# Patient Record
Sex: Male | Born: 1965
Health system: Southern US, Community
[De-identification: ages and names within clinical notes are randomized; demographics above are authoritative.]

## PROBLEM LIST (undated history)

## (undated) DIAGNOSIS — M48061 Spinal stenosis, lumbar region without neurogenic claudication: Secondary | ICD-10-CM

## (undated) DIAGNOSIS — E785 Hyperlipidemia, unspecified: Secondary | ICD-10-CM

## (undated) DIAGNOSIS — K219 Gastro-esophageal reflux disease without esophagitis: Secondary | ICD-10-CM

## (undated) DIAGNOSIS — I10 Essential (primary) hypertension: Secondary | ICD-10-CM

## (undated) DIAGNOSIS — T7840XA Allergy, unspecified, initial encounter: Secondary | ICD-10-CM

## (undated) DIAGNOSIS — E559 Vitamin D deficiency, unspecified: Secondary | ICD-10-CM

## (undated) DIAGNOSIS — K649 Unspecified hemorrhoids: Secondary | ICD-10-CM

## (undated) HISTORY — DX: Allergy, unspecified, initial encounter: T78.40XA

## (undated) HISTORY — DX: Essential (primary) hypertension: I10

## (undated) HISTORY — DX: Spinal stenosis, lumbar region without neurogenic claudication: M48.061

## (undated) HISTORY — DX: Hyperlipidemia, unspecified: E78.5

## (undated) HISTORY — DX: Gastro-esophageal reflux disease without esophagitis: K21.9

## (undated) HISTORY — PX: NO PAST SURGERIES: SHX2092

## (undated) HISTORY — DX: Vitamin D deficiency, unspecified: E55.9

## (undated) HISTORY — DX: Unspecified hemorrhoids: K64.9

---

## 2001-10-22 ENCOUNTER — Encounter: Payer: Self-pay | Admitting: Emergency Medicine

## 2001-10-22 ENCOUNTER — Emergency Department (HOSPITAL_COMMUNITY): Admission: EM | Admit: 2001-10-22 | Discharge: 2001-10-22 | Payer: Self-pay | Admitting: Emergency Medicine

## 2002-09-09 ENCOUNTER — Emergency Department (HOSPITAL_COMMUNITY): Admission: EM | Admit: 2002-09-09 | Discharge: 2002-09-10 | Payer: Self-pay

## 2008-01-14 ENCOUNTER — Emergency Department (HOSPITAL_COMMUNITY): Admission: EM | Admit: 2008-01-14 | Discharge: 2008-01-14 | Payer: Self-pay | Admitting: Emergency Medicine

## 2008-01-28 ENCOUNTER — Emergency Department (HOSPITAL_COMMUNITY): Admission: EM | Admit: 2008-01-28 | Discharge: 2008-01-28 | Payer: Self-pay | Admitting: Emergency Medicine

## 2009-06-15 ENCOUNTER — Emergency Department (HOSPITAL_COMMUNITY): Admission: EM | Admit: 2009-06-15 | Discharge: 2009-06-15 | Payer: Self-pay | Admitting: Family Medicine

## 2009-10-21 ENCOUNTER — Ambulatory Visit: Payer: Self-pay | Admitting: Internal Medicine

## 2009-11-19 ENCOUNTER — Ambulatory Visit: Payer: Self-pay | Admitting: Internal Medicine

## 2009-11-19 ENCOUNTER — Encounter (INDEPENDENT_AMBULATORY_CARE_PROVIDER_SITE_OTHER): Payer: Self-pay | Admitting: Family Medicine

## 2009-11-19 LAB — CONVERTED CEMR LAB
ALT: 22 units/L (ref 0–53)
AST: 15 units/L (ref 0–37)
Albumin: 4.4 g/dL (ref 3.5–5.2)
Alkaline Phosphatase: 54 units/L (ref 39–117)
BUN: 17 mg/dL (ref 6–23)
Basophils Absolute: 0 10*3/uL (ref 0.0–0.1)
Basophils Relative: 1 % (ref 0–1)
CO2: 27 meq/L (ref 19–32)
Calcium: 9.5 mg/dL (ref 8.4–10.5)
Chloride: 97 meq/L (ref 96–112)
Cholesterol: 217 mg/dL — ABNORMAL HIGH (ref 0–200)
Creatinine, Ser: 1.03 mg/dL (ref 0.40–1.50)
Eosinophils Absolute: 0.2 10*3/uL (ref 0.0–0.7)
Eosinophils Relative: 4 % (ref 0–5)
Glucose, Bld: 106 mg/dL — ABNORMAL HIGH (ref 70–99)
HCT: 45.4 % (ref 39.0–52.0)
HDL: 46 mg/dL (ref >39)
Helicobacter Pylori Antibody-IgG: 2.8 — ABNORMAL HIGH
Hemoglobin: 14.6 g/dL (ref 13.0–17.0)
LDL Cholesterol: 123 mg/dL — ABNORMAL HIGH (ref 0–99)
Lymphocytes Relative: 40 % (ref 12–46)
Lymphs Abs: 2.3 10*3/uL (ref 0.7–4.0)
MCHC: 32.2 g/dL (ref 30.0–36.0)
MCV: 87.3 fL (ref 78.0–100.0)
Monocytes Absolute: 0.3 10*3/uL (ref 0.1–1.0)
Monocytes Relative: 5 % (ref 3–12)
Neutro Abs: 2.9 10*3/uL (ref 1.7–7.7)
Neutrophils Relative %: 50 % (ref 43–77)
Platelets: 200 10*3/uL (ref 150–400)
Potassium: 4.4 meq/L (ref 3.5–5.3)
RBC: 5.2 M/uL (ref 4.22–5.81)
RDW: 14.9 % (ref 11.5–15.5)
Sodium: 135 meq/L (ref 135–145)
TSH: 2.111 microintl units/mL (ref 0.350–4.500)
Total Bilirubin: 0.4 mg/dL (ref 0.3–1.2)
Total CHOL/HDL Ratio: 4.7
Total Protein: 6.7 g/dL (ref 6.0–8.3)
Triglycerides: 238 mg/dL — ABNORMAL HIGH (ref <150)
VLDL: 48 mg/dL — ABNORMAL HIGH (ref 0–40)
Vit D, 25-Hydroxy: 33 ng/mL (ref 30–89)
WBC: 5.8 10*3/uL (ref 4.0–10.5)

## 2010-01-12 ENCOUNTER — Ambulatory Visit: Payer: Self-pay | Admitting: Internal Medicine

## 2010-01-12 ENCOUNTER — Encounter (INDEPENDENT_AMBULATORY_CARE_PROVIDER_SITE_OTHER): Payer: Self-pay | Admitting: Family Medicine

## 2010-01-12 LAB — CONVERTED CEMR LAB: Microalb, Ur: 6.69 mg/dL — ABNORMAL HIGH (ref 0.00–1.89)

## 2010-04-14 ENCOUNTER — Encounter (INDEPENDENT_AMBULATORY_CARE_PROVIDER_SITE_OTHER): Payer: Self-pay | Admitting: Family Medicine

## 2010-04-14 LAB — CONVERTED CEMR LAB
BUN: 19 mg/dL (ref 6–23)
CO2: 27 meq/L (ref 19–32)
Calcium: 9.5 mg/dL (ref 8.4–10.5)
Chloride: 101 meq/L (ref 96–112)
Creatinine, Ser: 0.9 mg/dL (ref 0.40–1.50)
Glucose, Bld: 95 mg/dL (ref 70–99)
Hgb A1c MFr Bld: 6.3 % — ABNORMAL HIGH (ref <5.7)
Potassium: 4.6 meq/L (ref 3.5–5.3)
Sodium: 138 meq/L (ref 135–145)

## 2013-03-26 ENCOUNTER — Ambulatory Visit: Payer: Self-pay | Attending: Internal Medicine

## 2013-03-28 ENCOUNTER — Ambulatory Visit: Payer: No Typology Code available for payment source | Attending: Internal Medicine | Admitting: Internal Medicine

## 2013-03-28 ENCOUNTER — Encounter: Payer: Self-pay | Admitting: Internal Medicine

## 2013-03-28 VITALS — BP 133/90 | HR 94 | Temp 98.0°F | Resp 16 | Ht 76.0 in | Wt 234.0 lb

## 2013-03-28 DIAGNOSIS — Z720 Tobacco use: Secondary | ICD-10-CM

## 2013-03-28 DIAGNOSIS — F17200 Nicotine dependence, unspecified, uncomplicated: Secondary | ICD-10-CM

## 2013-03-28 DIAGNOSIS — K219 Gastro-esophageal reflux disease without esophagitis: Secondary | ICD-10-CM

## 2013-03-28 DIAGNOSIS — I10 Essential (primary) hypertension: Secondary | ICD-10-CM

## 2013-03-28 MED ORDER — OMEPRAZOLE 40 MG PO CPDR: 40.0000 mg | DELAYED_RELEASE_CAPSULE | Freq: Every day | ORAL | Status: DC

## 2013-03-28 MED ORDER — LOSARTAN POTASSIUM 50 MG PO TABS: 50.0000 mg | ORAL_TABLET | Freq: Every day | ORAL | Status: DC

## 2013-03-28 NOTE — Progress Notes (Signed)
PT HERE TO ESTABLISH CARE HTN,ACID REFLUX  TAKING PRESCRIBED LOSARTAN POTASSIUM 50MG  TAB BUT STATES TOO EXPENSIVE NO OTHER MEDICAL HX NOTED

## 2013-03-28 NOTE — Progress Notes (Signed)
Patient ID: Timothy Sanchez, male   DOB: 29-May-1966, 47 y.o.   MRN: [id number] Patient Demographics  Timothy Sanchez, is a 47 y.o. male  FAO:[id number]  ONG:[id number]  DOB - [birth year]  Chief Complaint  Patient presents with  . Establish Care  . Hypertension  . Gastrophageal Reflux        Subjective:   Timothy Sanchez today is here to establish primary care. Patient has No headache, No chest pain, No abdominal pain - No Nausea, No new weakness tingling or numbness, No Cough - SOB Patient states that his antihypertensive is working and he needs refill. He occasional has acid reflux. Otherwise has been doing very well.  Objective:    Filed Vitals:   03/28/13 1612  BP: 133/90  Pulse: 94  Temp: 98 F (36.7 C)  TempSrc: Oral  Resp: 16  Height: 6\' 4"  (1.93 m)  Weight: 234 lb (106.142 kg)  SpO2: 94%     ALLERGIES:  No Known Allergies  PAST MEDICAL HISTORY: Past Medical History  Diagnosis Date  . Hypertension     PAST SURGICAL HISTORY: No past surgical history on file.  FAMILY HISTORY: History reviewed. No pertinent family history.  MEDICATIONS AT HOME: Prior to Admission medications   Medication Sig Start Date End Date Taking? Authorizing Provider  losartan (COZAAR) 50 MG tablet Take 1 tablet (50 mg total) by mouth daily. 03/28/13  Yes Ripudeep Jenna Luo, MD  omeprazole (PRILOSEC) 40 MG capsule Take 1 capsule (40 mg total) by mouth daily. 03/28/13   Ripudeep Jenna Luo, MD    REVIEW OF SYSTEMS:  Constitutional:   No   Fevers, chills, fatigue.  HEENT:    No headaches, Sore throat,   Cardio-vascular: No chest pain,  Orthopnea, swelling in lower extremities, anasarca, palpitations  GI:  No abdominal pain, nausea, vomiting, diarrhea  Resp: No shortness of breath,  No coughing up of blood.No cough.No wheezing.  Skin:  no rash or lesions.  GU:  no dysuria, change in color of urine, no urgency or frequency.  No flank pain.  Musculoskeletal: No joint pain or swelling.   No decreased range of motion.  No back pain.  Psych: No change in mood or affect. No depression or anxiety.  No memory loss.   Exam  General appearance :Awake, alert, NAD, Speech Clear. HEENT: Atraumatic and Normocephalic, PERLA Neck: supple, no JVD. No cervical lymphadenopathy.  Chest: clear to auscultation bilaterally, no wheezing, rales or rhonchi CVS: S1 S2 regular, no murmurs.  Abdomen: soft, NBS, NT, ND, no gaurding, rigidity or rebound. Extremities: No cyanosis, clubbing, B/L Lower Ext shows no edema,  Neurology: Awake alert, and oriented X 3, CN II-XII intact, Non focal Skin:No Rash or lesions Wounds: N/A    Data Review   Basic Metabolic Panel: No results found for this basename: NA, K, CL, CO2, GLUCOSE, BUN, CREATININE, CALCIUM, MG, PHOS,  in the last 168 hours Liver Function Tests: No results found for this basename: AST, ALT, ALKPHOS, BILITOT, PROT, ALBUMIN,  in the last 168 hours  CBC: No results found for this basename: WBC, NEUTROABS, HGB, HCT, MCV, PLT,  in the last 168 hours ------------------------------------------------------------------------------------------------------------------ No results found for this basename: HGBA1C,  in the last 72 hours ------------------------------------------------------------------------------------------------------------------ No results found for this basename: CHOL, HDL, LDLCALC, TRIG, CHOLHDL, LDLDIRECT,  in the last 72 hours ------------------------------------------------------------------------------------------------------------------ No results found for this basename: TSH, T4TOTAL, FREET3, T3FREE, THYROIDAB,  in the last 72 hours ------------------------------------------------------------------------------------------------------------------ No results found for this basename:  VITAMINB12, FOLATE, FERRITIN, TIBC, IRON, RETICCTPCT,  in the last 72 hours  Coagulation profile  No results found for this basename:  INR, PROTIME,  in the last 168 hours    Assessment & Plan   Active Problems: Hypertension - Currently stable, continue losartan 50 mg daily  GERD: Placed on omeprazole 40 mg daily  Nicotine abuse - Consultation to quit smoking  Health maintenance: Will get labs and give flu vaccine today  Recommendations: CBC, BMET, lipid panel  Follow-up in 3 months, earlier if any abnormality in the labs   RAI,RIPUDEEP M.D. 03/28/2013, 4:35 PM

## 2013-03-29 LAB — CBC WITH DIFFERENTIAL/PLATELET
Basophils Absolute: 0 10*3/uL (ref 0.0–0.1)
Basophils Relative: 1 % (ref 0–1)
Eosinophils Absolute: 0.1 10*3/uL (ref 0.0–0.7)
Eosinophils Relative: 2 % (ref 0–5)
HCT: 43 % (ref 39.0–52.0)
Hemoglobin: 14.3 g/dL (ref 13.0–17.0)
Lymphocytes Relative: 25 % (ref 12–46)
Lymphs Abs: 1.3 10*3/uL (ref 0.7–4.0)
MCH: 27.7 pg (ref 26.0–34.0)
MCHC: 33.3 g/dL (ref 30.0–36.0)
MCV: 83.3 fL (ref 78.0–100.0)
Monocytes Absolute: 0.3 10*3/uL (ref 0.1–1.0)
Monocytes Relative: 7 % (ref 3–12)
Neutro Abs: 3.5 10*3/uL (ref 1.7–7.7)
Neutrophils Relative %: 65 % (ref 43–77)
Platelets: 199 10*3/uL (ref 150–400)
RBC: 5.16 MIL/uL (ref 4.22–5.81)
RDW: 14.6 % (ref 11.5–15.5)
WBC: 5.3 10*3/uL (ref 4.0–10.5)

## 2013-03-29 LAB — BASIC METABOLIC PANEL
BUN: 20 mg/dL (ref 6–23)
CO2: 25 mEq/L (ref 19–32)
Calcium: 9.1 mg/dL (ref 8.4–10.5)
Chloride: 101 mEq/L (ref 96–112)
Creat: 0.77 mg/dL (ref 0.50–1.35)
Glucose, Bld: 99 mg/dL (ref 70–99)
Potassium: 4.7 mEq/L (ref 3.5–5.3)
Sodium: 134 mEq/L — ABNORMAL LOW (ref 135–145)

## 2013-03-29 LAB — LIPID PANEL
Cholesterol: 238 mg/dL — ABNORMAL HIGH (ref 0–200)
HDL: 47 mg/dL (ref >39)
Total CHOL/HDL Ratio: 5.1 Ratio
Triglycerides: 460 mg/dL — ABNORMAL HIGH (ref <150)

## 2013-06-27 ENCOUNTER — Ambulatory Visit: Payer: No Typology Code available for payment source

## 2013-09-09 ENCOUNTER — Other Ambulatory Visit: Payer: Self-pay | Admitting: Internal Medicine

## 2013-09-09 MED ORDER — OMEPRAZOLE 40 MG PO CPDR: 40.0000 mg | DELAYED_RELEASE_CAPSULE | Freq: Every day | ORAL | Status: DC

## 2013-09-09 MED ORDER — LOSARTAN POTASSIUM 50 MG PO TABS: 50.0000 mg | ORAL_TABLET | Freq: Every day | ORAL | Status: DC

## 2014-02-05 ENCOUNTER — Other Ambulatory Visit: Payer: Self-pay | Admitting: Internal Medicine

## 2014-02-05 DIAGNOSIS — I10 Essential (primary) hypertension: Secondary | ICD-10-CM

## 2014-02-05 DIAGNOSIS — K219 Gastro-esophageal reflux disease without esophagitis: Secondary | ICD-10-CM

## 2014-03-06 ENCOUNTER — Telehealth: Payer: Self-pay | Admitting: Internal Medicine

## 2014-03-06 ENCOUNTER — Other Ambulatory Visit: Payer: Self-pay | Admitting: Internal Medicine

## 2014-03-06 DIAGNOSIS — I10 Essential (primary) hypertension: Secondary | ICD-10-CM

## 2014-03-06 DIAGNOSIS — K219 Gastro-esophageal reflux disease without esophagitis: Secondary | ICD-10-CM

## 2014-03-06 NOTE — Telephone Encounter (Signed)
Pt has appt on 03/11/14 for med refill but has run out of HTN meds and request refill. Please f/u with pt.

## 2014-03-07 ENCOUNTER — Encounter (HOSPITAL_COMMUNITY): Payer: Self-pay | Admitting: Emergency Medicine

## 2014-03-07 ENCOUNTER — Emergency Department (INDEPENDENT_AMBULATORY_CARE_PROVIDER_SITE_OTHER)
Admission: EM | Admit: 2014-03-07 | Discharge: 2014-03-07 | Disposition: A | Discharge status: Home / Self Care | Payer: Self-pay | Source: Home / Self Care | Attending: Family Medicine | Admitting: Family Medicine

## 2014-03-07 DIAGNOSIS — I10 Essential (primary) hypertension: Secondary | ICD-10-CM

## 2014-03-07 MED ORDER — LOSARTAN POTASSIUM 50 MG PO TABS: 50.0000 mg | ORAL_TABLET | Freq: Every day | ORAL | Status: DC

## 2014-03-07 NOTE — ED Provider Notes (Signed)
CSN: [id number]     Arrival date & time 03/07/14  51 History   First MD Initiated Contact with Patient 03/07/14 1524     Chief Complaint  Patient presents with  . Medication Refill   (Consider location/radiation/quality/duration/timing/severity/associated sxs/prior Treatment) Patient is a 48 y.o. male presenting with hypertension. The history is provided by the patient.  Hypertension This is a chronic problem. Episode onset: here for med refill on bp med, no side effectsor problems., has appt with [facility] on mon. The problem has not changed since onset.Pertinent negatives include no chest pain and no headaches.    Past Medical History  Diagnosis Date  . Hypertension    History reviewed. No pertinent past surgical history. History reviewed. No pertinent family history. History  Substance Use Topics  . Smoking status: Heavy Tobacco Smoker -- 1.50 packs/day for 25 years    Types: Cigarettes    Start date: 03/26/2013  . Smokeless tobacco: Not on file  . Alcohol Use: Not on file    Review of Systems  Constitutional: Negative.   Respiratory: Negative.   Cardiovascular: Negative.  Negative for chest pain.  Neurological: Negative for headaches.    Allergies  Review of patient's allergies indicates no known allergies.  Home Medications   Prior to Admission medications   Medication Sig Start Date End Date Taking? Authorizing Provider  losartan (COZAAR) 50 MG tablet TAKE 1 TABLET BY MOUTH DAILY. *NEEDS APPOITNMENT FOR MORE REFILLS* 03/06/14   Angelica Chessman, MD  losartan (COZAAR) 50 MG tablet Take 1 tablet (50 mg total) by mouth daily. 03/07/14   Billy Fischer, MD  omeprazole (PRILOSEC) 40 MG capsule TAKE 1 CAPSULE BY MOUTH DAILY. *NEEDS APPOINTMENT FOR MORE REFILLS* 03/06/14   Angelica Chessman, MD   BP 137/93  Pulse 104  Temp(Src) 98.7 F (37.1 C) (Oral)  Resp 20  SpO2 94% Physical Exam  Nursing note and vitals reviewed. Constitutional: He is oriented to person, place, and  time. He appears well-developed and well-nourished.  Neck: Normal range of motion. Neck supple.  Cardiovascular: Normal heart sounds.   Lymphadenopathy:    He has no cervical adenopathy.  Neurological: He is alert and oriented to person, place, and time.    ED Course  Procedures (including critical care time) Labs Review Labs Reviewed - No data to display  Imaging Review No results found.   MDM   1. Essential hypertension        Billy Fischer, MD 03/07/14 1547

## 2014-03-07 NOTE — Discharge Instructions (Signed)
See your doctor for further routine medical care. No more smoking.

## 2014-03-07 NOTE — ED Notes (Signed)
Here  For  Medication refill      he needs some  losarteen   50 mg  Daily

## 2014-03-11 ENCOUNTER — Ambulatory Visit: Payer: Self-pay | Attending: Internal Medicine | Admitting: Internal Medicine

## 2014-03-11 ENCOUNTER — Encounter: Payer: Self-pay | Admitting: Internal Medicine

## 2014-03-11 VITALS — BP 130/90 | HR 104 | Temp 97.6°F | Resp 16 | Wt 225.8 lb

## 2014-03-11 DIAGNOSIS — Z720 Tobacco use: Secondary | ICD-10-CM

## 2014-03-11 DIAGNOSIS — Z139 Encounter for screening, unspecified: Secondary | ICD-10-CM

## 2014-03-11 DIAGNOSIS — K219 Gastro-esophageal reflux disease without esophagitis: Secondary | ICD-10-CM | POA: Insufficient documentation

## 2014-03-11 DIAGNOSIS — F17200 Nicotine dependence, unspecified, uncomplicated: Secondary | ICD-10-CM | POA: Insufficient documentation

## 2014-03-11 DIAGNOSIS — I10 Essential (primary) hypertension: Secondary | ICD-10-CM | POA: Insufficient documentation

## 2014-03-11 DIAGNOSIS — Z79899 Other long term (current) drug therapy: Secondary | ICD-10-CM | POA: Insufficient documentation

## 2014-03-11 DIAGNOSIS — E781 Pure hyperglyceridemia: Secondary | ICD-10-CM | POA: Insufficient documentation

## 2014-03-11 LAB — COMPLETE METABOLIC PANEL WITH GFR
ALK PHOS: 50 U/L (ref 39–117)
ALT: 20 U/L (ref 0–53)
AST: 19 U/L (ref 0–37)
Albumin: 4.3 g/dL (ref 3.5–5.2)
BUN: 21 mg/dL (ref 6–23)
CALCIUM: 8.9 mg/dL (ref 8.4–10.5)
CHLORIDE: 100 meq/L (ref 96–112)
CO2: 28 mEq/L (ref 19–32)
Creat: 0.85 mg/dL (ref 0.50–1.35)
GFR, Est African American: >89 mL/min
GFR, Est Non African American: >89 mL/min
Glucose, Bld: 107 mg/dL — ABNORMAL HIGH (ref 70–99)
Potassium: 4.3 mEq/L (ref 3.5–5.3)
Sodium: 136 mEq/L (ref 135–145)
Total Bilirubin: 0.5 mg/dL (ref 0.2–1.2)
Total Protein: 6.6 g/dL (ref 6.0–8.3)

## 2014-03-11 LAB — CBC WITH DIFFERENTIAL/PLATELET
BASOS ABS: 0 10*3/uL (ref 0.0–0.1)
BASOS PCT: 1 % (ref 0–1)
Eosinophils Absolute: 0.2 10*3/uL (ref 0.0–0.7)
Eosinophils Relative: 5 % (ref 0–5)
HEMATOCRIT: 44.1 % (ref 39.0–52.0)
Hemoglobin: 14.7 g/dL (ref 13.0–17.0)
Lymphocytes Relative: 37 % (ref 12–46)
Lymphs Abs: 1.7 10*3/uL (ref 0.7–4.0)
MCH: 27.9 pg (ref 26.0–34.0)
MCHC: 33.3 g/dL (ref 30.0–36.0)
MCV: 83.7 fL (ref 78.0–100.0)
MONO ABS: 0.4 10*3/uL (ref 0.1–1.0)
Monocytes Relative: 8 % (ref 3–12)
Neutro Abs: 2.2 10*3/uL (ref 1.7–7.7)
Neutrophils Relative %: 49 % (ref 43–77)
Platelets: 213 10*3/uL (ref 150–400)
RBC: 5.27 MIL/uL (ref 4.22–5.81)
RDW: 14.4 % (ref 11.5–15.5)
WBC: 4.5 10*3/uL (ref 4.0–10.5)

## 2014-03-11 LAB — LIPID PANEL
CHOL/HDL RATIO: 5.9 ratio
CHOLESTEROL: 266 mg/dL — AB (ref 0–200)
HDL: 45 mg/dL (ref >39)
LDL Cholesterol: 169 mg/dL — ABNORMAL HIGH (ref 0–99)
Triglycerides: 260 mg/dL — ABNORMAL HIGH (ref <150)
VLDL: 52 mg/dL — ABNORMAL HIGH (ref 0–40)

## 2014-03-11 MED ORDER — OMEPRAZOLE 40 MG PO CPDR: 40.0000 mg | DELAYED_RELEASE_CAPSULE | Freq: Every day | ORAL | Status: DC

## 2014-03-11 MED ORDER — LOSARTAN POTASSIUM 50 MG PO TABS: ORAL_TABLET | ORAL | Status: DC

## 2014-03-11 NOTE — Progress Notes (Signed)
MRN: [id number] Name: Timothy Sanchez  Sex: male Age: 48 y.o. DOB: 1965/11/01  Allergies: Review of patient's allergies indicates no known allergies.  Chief Complaint  Patient presents with  . Follow-up    HTN    HPI: Patient is 48 y.o. male who has history of hypertension, GERD , hypertriglyceridemia comes today for followup and requesting refill on his medications, denies any headache dizziness chest and shortness of breath, patient smokes cigarettes, I have advised patient to quit smoking, patient has never taken any medication for elevated triglycerides. Today the blood pressure is borderline elevated. As per patient is compliant in taking medications.   Past Medical History  Diagnosis Date  . Hypertension     History reviewed. No pertinent past surgical history.    Medication List       This list is accurate as of: 03/11/14  5:23 PM.  Always use your most recent med list.               losartan 50 MG tablet  Commonly known as:  COZAAR  TAKE 1 TABLET BY MOUTH DAILY.     omeprazole 40 MG capsule  Commonly known as:  PRILOSEC  Take 1 capsule (40 mg total) by mouth daily.        Meds ordered this encounter  Medications  . losartan (COZAAR) 50 MG tablet    Sig: TAKE 1 TABLET BY MOUTH DAILY.    Dispense:  30 tablet    Refill:  3  . omeprazole (PRILOSEC) 40 MG capsule    Sig: Take 1 capsule (40 mg total) by mouth daily.    Dispense:  30 capsule    Refill:  3    Immunization History  Administered Date(s) Administered  . Influenza,inj,Quad PF,36+ Mos 03/28/2013    History reviewed. No pertinent family history.  History  Substance Use Topics  . Smoking status: Heavy Tobacco Smoker -- 1.50 packs/day for 25 years    Types: Cigarettes    Start date: 03/26/2013  . Smokeless tobacco: Not on file  . Alcohol Use: Not on file    Review of Systems   As noted in HPI  Filed Vitals:   03/11/14 1718  BP: 130/90  Pulse:   Temp:   Resp:      Physical Exam  Physical Exam  Constitutional: No distress.  Eyes: EOM are normal. Pupils are equal, round, and reactive to light.  Cardiovascular: Normal rate and regular rhythm.   Pulmonary/Chest: Breath sounds normal. No respiratory distress. He has no wheezes. He has no rales.  Abdominal: Soft. There is no tenderness.  Musculoskeletal: He exhibits no edema.    CBC    Component Value Date/Time   WBC 5.3 03/28/2013 1639   RBC 5.16 03/28/2013 1639   HGB 14.3 03/28/2013 1639   HCT 43.0 03/28/2013 1639   PLT 199 03/28/2013 1639   MCV 83.3 03/28/2013 1639   LYMPHSABS 1.3 03/28/2013 1639   MONOABS 0.3 03/28/2013 1639   EOSABS 0.1 03/28/2013 1639   BASOSABS 0.0 03/28/2013 1639    CMP     Component Value Date/Time   NA 134* 03/28/2013 1639   K 4.7 03/28/2013 1639   CL 101 03/28/2013 1639   CO2 25 03/28/2013 1639   GLUCOSE 99 03/28/2013 1639   BUN 20 03/28/2013 1639   CREATININE 0.77 03/28/2013 1639   CREATININE 0.90 04/14/2010 2206   CALCIUM 9.1 03/28/2013 1639   PROT 6.7 11/19/2009 2035   ALBUMIN 4.4  11/19/2009 2035   AST 15 11/19/2009 2035   ALT 22 11/19/2009 2035   ALKPHOS 54 11/19/2009 2035   BILITOT 0.4 11/19/2009 2035    Lab Results  Component Value Date/Time   CHOL 238* 03/28/2013  4:39 PM    No components found with this basename: hga1c    Lab Results  Component Value Date/Time   AST 15 11/19/2009  8:35 PM    Assessment and Plan  Essential hypertension - Plan: Have advised patient for DASH diet, continue with her losartan (COZAAR) 50 MG tablet,, will repeat  COMPLETE METABOLIC PANEL WITH GFR  Gastroesophageal reflux disease without esophagitis - Plan: Zestril modification, continue with omeprazole (PRILOSEC) 40 MG capsule  Nicotine abuse Consultation to quit smoking, as per patient he will try on his own.  Hypertriglyceridemia - Plan: Advised for low-fat diet, repeat  Lipid panel  Screening - Plan: CBC with Differential, TSH, Vit D  25 hydroxy (rtn osteoporosis  monitoring)     Return in about 3 months (around 06/11/2014) for hypertension, hypertriglycridemia.  Lorayne Marek, MD

## 2014-03-11 NOTE — Progress Notes (Signed)
Patient here for follow up on his HTN and in need  of medication refills

## 2014-03-11 NOTE — Patient Instructions (Addendum)
DASH Eating Plan DASH stands for "Dietary Approaches to Stop Hypertension." The DASH eating plan is a healthy eating plan that has been shown to reduce high blood pressure (hypertension). Additional health benefits may include reducing the risk of type 2 diabetes mellitus, heart disease, and stroke. The DASH eating plan may also help with weight loss. WHAT DO I NEED TO KNOW ABOUT THE DASH EATING PLAN? For the DASH eating plan, you will follow these general guidelines:  Choose foods with a percent daily value for sodium of less than 5% (as listed on the food label).  Use salt-free seasonings or herbs instead of table salt or sea salt.  Check with your health care provider or pharmacist before using salt substitutes.  Eat lower-sodium products, often labeled as "lower sodium" or "no salt added."  Eat fresh foods.  Eat more vegetables, fruits, and low-fat dairy products.  Choose whole grains. Look for the word "whole" as the first word in the ingredient list.  Choose fish and skinless chicken or turkey more often than red meat. Limit fish, poultry, and meat to 6 oz (170 g) each day.  Limit sweets, desserts, sugars, and sugary drinks.  Choose heart-healthy fats.  Limit cheese to 1 oz (28 g) per day.  Eat more home-cooked food and less restaurant, buffet, and fast food.  Limit fried foods.  Cook foods using methods other than frying.  Limit canned vegetables. If you do use them, rinse them well to decrease the sodium.  When eating at a restaurant, ask that your food be prepared with less salt, or no salt if possible. WHAT FOODS CAN I EAT? Seek help from a dietitian for individual calorie needs. Grains Whole grain or whole wheat bread. Brown rice. Whole grain or whole wheat pasta. Quinoa, bulgur, and whole grain cereals. Low-sodium cereals. Corn or whole wheat flour tortillas. Whole grain cornbread. Whole grain crackers. Low-sodium crackers. Vegetables Fresh or frozen vegetables  (raw, steamed, roasted, or grilled). Low-sodium or reduced-sodium tomato and vegetable juices. Low-sodium or reduced-sodium tomato sauce and paste. Low-sodium or reduced-sodium canned vegetables.  Fruits All fresh, canned (in natural juice), or frozen fruits. Meat and Other Protein Products Ground beef (85% or leaner), grass-fed beef, or beef trimmed of fat. Skinless chicken or turkey. Ground chicken or turkey. Pork trimmed of fat. All fish and seafood. Eggs. Dried beans, peas, or lentils. Unsalted nuts and seeds. Unsalted canned beans. Dairy Low-fat dairy products, such as skim or 1% milk, 2% or reduced-fat cheeses, low-fat ricotta or cottage cheese, or plain low-fat yogurt. Low-sodium or reduced-sodium cheeses. Fats and Oils Tub margarines without trans fats. Light or reduced-fat mayonnaise and salad dressings (reduced sodium). Avocado. Safflower, olive, or canola oils. Natural peanut or almond butter. Other Unsalted popcorn and pretzels. The items listed above may not be a complete list of recommended foods or beverages. Contact your dietitian for more options. WHAT FOODS ARE NOT RECOMMENDED? Grains White bread. White pasta. White rice. Refined cornbread. Bagels and croissants. Crackers that contain trans fat. Vegetables Creamed or fried vegetables. Vegetables in a cheese sauce. Regular canned vegetables. Regular canned tomato sauce and paste. Regular tomato and vegetable juices. Fruits Dried fruits. Canned fruit in light or heavy syrup. Fruit juice. Meat and Other Protein Products Fatty cuts of meat. Ribs, chicken wings, bacon, sausage, bologna, salami, chitterlings, fatback, hot dogs, bratwurst, and packaged luncheon meats. Salted nuts and seeds. Canned beans with salt. Dairy Whole or 2% milk, cream, half-and-half, and cream cheese. Whole-fat or sweetened yogurt. Full-fat   cheeses or blue cheese. Nondairy creamers and whipped toppings. Processed cheese, cheese spreads, or cheese  curds. Condiments Onion and garlic salt, seasoned salt, table salt, and sea salt. Canned and packaged gravies. Worcestershire sauce. Tartar sauce. Barbecue sauce. Teriyaki sauce. Soy sauce, including reduced sodium. Steak sauce. Fish sauce. Oyster sauce. Cocktail sauce. Horseradish. Ketchup and mustard. Meat flavorings and tenderizers. Bouillon cubes. Hot sauce. Tabasco sauce. Marinades. Taco seasonings. Relishes. Fats and Oils Butter, stick margarine, lard, shortening, ghee, and bacon fat. Coconut, palm kernel, or palm oils. Regular salad dressings. Other Pickles and olives. Salted popcorn and pretzels. The items listed above may not be a complete list of foods and beverages to avoid. Contact your dietitian for more information. WHERE CAN I FIND MORE INFORMATION? National Heart, Lung, and [employer] Institute: www.nhlbi.nih.gov/health/health-topics/topics/dash/ Document Released: 06/30/2011 Document Revised: 11/25/2013 Document Reviewed: 05/15/2013 Exit[employer] Patient Information 2015 [employer]. This information is not intended to replace advice given to you by your health care provider. Make sure you discuss any questions you have with your health care provider. Fat and Cholesterol Control Diet Fat and cholesterol levels in your blood and organs are influenced by your diet. High levels of fat and cholesterol may lead to diseases of the heart, small and large blood vessels, gallbladder, liver, and pancreas. CONTROLLING FAT AND CHOLESTEROL WITH DIET Although exercise and lifestyle factors are important, your diet is key. That is because certain foods are known to raise cholesterol and others to lower it. The goal is to balance foods for their effect on cholesterol and more importantly, to replace saturated and trans fat with other types of fat, such as monounsaturated fat, polyunsaturated fat, and omega-3 fatty acids. On average, a person should consume no more than 15 to 17 g of saturated fat daily.  Saturated and trans fats are considered "bad" fats, and they will raise LDL cholesterol. Saturated fats are primarily found in animal products such as meats, butter, and cream. However, that does not mean you need to give up all your favorite foods. Today, there are good tasting, low-fat, low-cholesterol substitutes for most of the things you like to eat. Choose low-fat or nonfat alternatives. Choose round or loin cuts of red meat. These types of cuts are lowest in fat and cholesterol. Chicken (without the skin), fish, veal, and ground turkey breast are great choices. Eliminate fatty meats, such as hot dogs and salami. Even shellfish have little or no saturated fat. Have a 3 oz (85 g) portion when you eat lean meat, poultry, or fish. Trans fats are also called "partially hydrogenated oils." They are oils that have been scientifically manipulated so that they are solid at room temperature resulting in a longer shelf life and improved taste and texture of foods in which they are added. Trans fats are found in stick margarine, some tub margarines, cookies, crackers, and baked goods.  When baking and cooking, oils are a great substitute for butter. The monounsaturated oils are especially beneficial since it is believed they lower LDL and raise HDL. The oils you should avoid entirely are saturated tropical oils, such as coconut and palm.  Remember to eat a lot from food groups that are naturally free of saturated and trans fat, including fish, fruit, vegetables, beans, grains (barley, rice, couscous, bulgur wheat), and pasta (without cream sauces).  IDENTIFYING FOODS THAT LOWER FAT AND CHOLESTEROL  Soluble fiber may lower your cholesterol. This type of fiber is found in fruits such as apples, vegetables such as broccoli, potatoes, and carrots,   legumes such as beans, peas, and lentils, and grains such as barley. Foods fortified with plant sterols (phytosterol) may also lower cholesterol. You should eat at least 2 g  per day of these foods for a cholesterol lowering effect.  Read package labels to identify low-saturated fats, trans fat free, and low-fat foods at the supermarket. Select cheeses that have only 2 to 3 g saturated fat per ounce. Use a heart-healthy tub margarine that is free of trans fats or partially hydrogenated oil. When buying baked goods (cookies, crackers), avoid partially hydrogenated oils. Breads and muffins should be made from whole grains (whole-wheat or whole oat flour, instead of "flour" or "enriched flour"). Buy non-creamy canned soups with reduced salt and no added fats.  FOOD PREPARATION TECHNIQUES  Never deep-fry. If you must fry, either stir-fry, which uses very little fat, or use non-stick cooking sprays. When possible, broil, bake, or roast meats, and steam vegetables. Instead of putting butter or margarine on vegetables, use lemon and herbs, applesauce, and cinnamon (for squash and sweet potatoes). Use nonfat yogurt, salsa, and low-fat dressings for salads.  LOW-SATURATED FAT / LOW-FAT FOOD SUBSTITUTES Meats / Saturated Fat (g)  Avoid: Steak, marbled (3 oz/85 g) / 11 g  Choose: Steak, lean (3 oz/85 g) / 4 g  Avoid: Hamburger (3 oz/85 g) / 7 g  Choose: Hamburger, lean (3 oz/85 g) / 5 g  Avoid: Ham (3 oz/85 g) / 6 g  Choose: Ham, lean cut (3 oz/85 g) / 2.4 g  Avoid: Chicken, with skin, dark meat (3 oz/85 g) / 4 g  Choose: Chicken, skin removed, dark meat (3 oz/85 g) / 2 g  Avoid: Chicken, with skin, light meat (3 oz/85 g) / 2.5 g  Choose: Chicken, skin removed, light meat (3 oz/85 g) / 1 g Dairy / Saturated Fat (g)  Avoid: Whole milk (1 cup) / 5 g  Choose: Low-fat milk, 2% (1 cup) / 3 g  Choose: Low-fat milk, 1% (1 cup) / 1.5 g  Choose: Skim milk (1 cup) / 0.3 g  Avoid: Hard cheese (1 oz/28 g) / 6 g  Choose: Skim milk cheese (1 oz/28 g) / 2 to 3 g  Avoid: Cottage cheese, 4% fat (1 cup) / 6.5 g  Choose: Low-fat cottage cheese, 1% fat (1 cup) / 1.5  g  Avoid: Ice cream (1 cup) / 9 g  Choose: Sherbet (1 cup) / 2.5 g  Choose: Nonfat frozen yogurt (1 cup) / 0.3 g  Choose: Frozen fruit bar / trace  Avoid: Whipped cream (1 tbs) / 3.5 g  Choose: Nondairy whipped topping (1 tbs) / 1 g Condiments / Saturated Fat (g)  Avoid: Mayonnaise (1 tbs) / 2 g  Choose: Low-fat mayonnaise (1 tbs) / 1 g  Avoid: Butter (1 tbs) / 7 g  Choose: Extra light margarine (1 tbs) / 1 g  Avoid: Coconut oil (1 tbs) / 11.8 g  Choose: Olive oil (1 tbs) / 1.8 g  Choose: Corn oil (1 tbs) / 1.7 g  Choose: Safflower oil (1 tbs) / 1.2 g  Choose: Sunflower oil (1 tbs) / 1.4 g  Choose: Soybean oil (1 tbs) / 2.4 g  Choose: Canola oil (1 tbs) / 1 g Document Released: 07/11/2005 Document Revised: 11/05/2012 Document Reviewed: 10/09/2013 [employer] Patient Information 2015 [employer]. This information is not intended to replace advice given to you by your health care provider. Make sure you discuss any questions you have with your health care   provider.  

## 2014-03-12 LAB — TSH: TSH: 1.256 u[IU]/mL (ref 0.350–4.500)

## 2014-03-12 LAB — VITAMIN D 25 HYDROXY (VIT D DEFICIENCY, FRACTURES): Vit D, 25-Hydroxy: 12 ng/mL — ABNORMAL LOW (ref 30–89)

## 2014-03-13 ENCOUNTER — Telehealth: Payer: Self-pay

## 2014-03-13 MED ORDER — ATORVASTATIN CALCIUM 20 MG PO TABS: 20.0000 mg | ORAL_TABLET | Freq: Every day | ORAL | Status: DC

## 2014-03-13 MED ORDER — VITAMIN D (ERGOCALCIFEROL) 1.25 MG (50000 UNIT) PO CAPS: 50000.0000 [IU] | ORAL_CAPSULE | ORAL | Status: DC

## 2014-03-13 NOTE — Telephone Encounter (Signed)
Patient not available Mail box is disabled unable to leave message

## 2014-03-13 NOTE — Telephone Encounter (Signed)
Message copied by Dorothe Pea on Thu Mar 13, 2014 10:17 AM ------      Message from: Lorayne Marek      Created: Wed Mar 12, 2014 10:31 AM       Blood work reviewed, noticed low vitamin D, call patient advise to start ergocalciferol 50,000 units once a week for the duration of  12 weeks.      Also patient has high cholesterol and high triglycerides, advise him for low-fat diet, and take Lipitor 20 mg daily, will repeat lipid panel on the next visit. ------

## 2014-07-04 ENCOUNTER — Other Ambulatory Visit: Payer: Self-pay | Admitting: Internal Medicine

## 2014-08-04 ENCOUNTER — Other Ambulatory Visit: Payer: Self-pay | Admitting: Internal Medicine

## 2014-08-06 ENCOUNTER — Other Ambulatory Visit: Payer: Self-pay | Admitting: Internal Medicine

## 2014-08-06 ENCOUNTER — Other Ambulatory Visit: Payer: Self-pay | Admitting: *Deleted

## 2014-08-06 DIAGNOSIS — K219 Gastro-esophageal reflux disease without esophagitis: Secondary | ICD-10-CM

## 2014-08-06 DIAGNOSIS — I10 Essential (primary) hypertension: Secondary | ICD-10-CM

## 2014-08-06 MED ORDER — OMEPRAZOLE 40 MG PO CPDR: 40.0000 mg | DELAYED_RELEASE_CAPSULE | Freq: Every day | ORAL | Status: DC

## 2014-08-06 MED ORDER — LOSARTAN POTASSIUM 50 MG PO TABS: ORAL_TABLET | ORAL | Status: DC

## 2014-08-06 NOTE — Telephone Encounter (Signed)
Patient came into facility to request a medication refill for omeprazole (PRILOSEC) 40 MG capsule. Please f/u with pt.

## 2014-08-06 NOTE — Telephone Encounter (Signed)
Pt in our front office requesting Rx refills Rx Omeprazole and Rx Losartan was send to [facility]

## 2014-09-05 ENCOUNTER — Other Ambulatory Visit: Payer: Self-pay | Admitting: *Deleted

## 2014-09-05 ENCOUNTER — Other Ambulatory Visit: Payer: Self-pay | Admitting: General Practice

## 2014-09-05 ENCOUNTER — Other Ambulatory Visit: Payer: Self-pay | Admitting: Internal Medicine

## 2014-09-05 DIAGNOSIS — I10 Essential (primary) hypertension: Secondary | ICD-10-CM

## 2014-09-05 DIAGNOSIS — K219 Gastro-esophageal reflux disease without esophagitis: Secondary | ICD-10-CM

## 2014-09-05 MED ORDER — OMEPRAZOLE 40 MG PO CPDR: 40.0000 mg | DELAYED_RELEASE_CAPSULE | Freq: Every day | ORAL | Status: DC

## 2014-09-05 MED ORDER — LOSARTAN POTASSIUM 50 MG PO TABS: ORAL_TABLET | ORAL | Status: DC

## 2014-09-05 NOTE — Telephone Encounter (Signed)
Rx refill, pt aware

## 2014-09-05 NOTE — Telephone Encounter (Signed)
Patient presents to clinic to speak to a nurse in regards to medication refill for medicine that he takes acid reflux. Please contact pt to assist.

## 2014-10-22 ENCOUNTER — Ambulatory Visit: Payer: Self-pay | Attending: Internal Medicine | Admitting: Internal Medicine

## 2014-10-22 ENCOUNTER — Encounter: Payer: Self-pay | Admitting: Internal Medicine

## 2014-10-22 VITALS — BP 135/83 | HR 104 | Temp 98.0°F | Resp 16 | Wt 225.0 lb

## 2014-10-22 DIAGNOSIS — Z79899 Other long term (current) drug therapy: Secondary | ICD-10-CM | POA: Insufficient documentation

## 2014-10-22 DIAGNOSIS — I10 Essential (primary) hypertension: Secondary | ICD-10-CM

## 2014-10-22 DIAGNOSIS — F17210 Nicotine dependence, cigarettes, uncomplicated: Secondary | ICD-10-CM | POA: Insufficient documentation

## 2014-10-22 DIAGNOSIS — E559 Vitamin D deficiency, unspecified: Secondary | ICD-10-CM

## 2014-10-22 DIAGNOSIS — K219 Gastro-esophageal reflux disease without esophagitis: Secondary | ICD-10-CM

## 2014-10-22 DIAGNOSIS — E785 Hyperlipidemia, unspecified: Secondary | ICD-10-CM

## 2014-10-22 DIAGNOSIS — F1910 Other psychoactive substance abuse, uncomplicated: Secondary | ICD-10-CM

## 2014-10-22 DIAGNOSIS — Z720 Tobacco use: Secondary | ICD-10-CM

## 2014-10-22 LAB — COMPLETE METABOLIC PANEL WITH GFR
ALT: 21 U/L (ref 0–53)
AST: 17 U/L (ref 0–37)
Albumin: 4.1 g/dL (ref 3.5–5.2)
Alkaline Phosphatase: 55 U/L (ref 39–117)
BILIRUBIN TOTAL: 0.3 mg/dL (ref 0.2–1.2)
BUN: 14 mg/dL (ref 6–23)
CHLORIDE: 100 meq/L (ref 96–112)
CO2: 27 mEq/L (ref 19–32)
Calcium: 9.9 mg/dL (ref 8.4–10.5)
Creat: 0.75 mg/dL (ref 0.50–1.35)
GFR, Est Non African American: >89 mL/min
GLUCOSE: 104 mg/dL — AB (ref 70–99)
Potassium: 4.5 mEq/L (ref 3.5–5.3)
SODIUM: 135 meq/L (ref 135–145)
Total Protein: 6.6 g/dL (ref 6.0–8.3)

## 2014-10-22 LAB — LIPID PANEL
CHOL/HDL RATIO: 5.7 ratio
CHOLESTEROL: 224 mg/dL — AB (ref 0–200)
HDL: 39 mg/dL — ABNORMAL LOW (ref >=40)
LDL CALC: 160 mg/dL — AB (ref 0–99)
Triglycerides: 125 mg/dL (ref <150)
VLDL: 25 mg/dL (ref 0–40)

## 2014-10-22 LAB — TSH: TSH: 1.347 u[IU]/mL (ref 0.350–4.500)

## 2014-10-22 MED ORDER — ATORVASTATIN CALCIUM 20 MG PO TABS: 20.0000 mg | ORAL_TABLET | Freq: Every day | ORAL | Status: DC

## 2014-10-22 MED ORDER — LOSARTAN POTASSIUM 50 MG PO TABS: ORAL_TABLET | ORAL | Status: DC

## 2014-10-22 MED ORDER — OMEPRAZOLE 40 MG PO CPDR: 40.0000 mg | DELAYED_RELEASE_CAPSULE | Freq: Every day | ORAL | Status: DC

## 2014-10-22 NOTE — Progress Notes (Signed)
Patient is here for refill on his prilosec Was told he had to be seen in order to get refills

## 2014-10-22 NOTE — Patient Instructions (Signed)
Smoking Cessation Quitting smoking is important to your health and has many advantages. However, it is not always easy to quit since nicotine is a very addictive drug. Oftentimes, people try 3 times or more before being able to quit. This document explains the best ways for you to prepare to quit smoking. Quitting takes hard work and a lot of effort, but you can do it. ADVANTAGES OF QUITTING SMOKING  You will live longer, feel better, and live better.  Your body will feel the impact of quitting smoking almost immediately.  Within 20 minutes, blood pressure decreases. Your pulse returns to its normal level.  After 8 hours, carbon monoxide levels in the blood return to normal. Your oxygen level increases.  After 24 hours, the chance of having a heart attack starts to decrease. Your breath, hair, and body stop smelling like smoke.  After 48 hours, damaged nerve endings begin to recover. Your sense of taste and smell improve.  After 72 hours, the body is virtually free of nicotine. Your bronchial tubes relax and breathing becomes easier.  After 2 to 12 weeks, lungs can hold more air. Exercise becomes easier and circulation improves.  The risk of having a heart attack, stroke, cancer, or lung disease is greatly reduced.  After 1 year, the risk of coronary heart disease is cut in half.  After 5 years, the risk of stroke falls to the same as a nonsmoker.  After 10 years, the risk of lung cancer is cut in half and the risk of other cancers decreases significantly.  After 15 years, the risk of coronary heart disease drops, usually to the level of a nonsmoker.  If you are pregnant, quitting smoking will improve your chances of having a healthy baby.  The people you live with, especially any children, will be healthier.  You will have extra money to spend on things other than cigarettes. QUESTIONS TO THINK ABOUT BEFORE ATTEMPTING TO QUIT You may want to talk about your answers with your  health care provider.  Why do you want to quit?  If you tried to quit in the past, what helped and what did not?  What will be the most difficult situations for you after you quit? How will you plan to handle them?  Who can help you through the tough times? Your family? Friends? A health care provider?  What pleasures do you get from smoking? What ways can you still get pleasure if you quit? Here are some questions to ask your health care provider:  How can you help me to be successful at quitting?  What medicine do you think would be best for me and how should I take it?  What should I do if I need more help?  What is smoking withdrawal like? How can I get information on withdrawal? GET READY  Set a quit date.  Change your environment by getting rid of all cigarettes, ashtrays, matches, and lighters in your home, car, or work. Do not let people smoke in your home.  Review your past attempts to quit. Think about what worked and what did not. GET SUPPORT AND ENCOURAGEMENT You have a better chance of being successful if you have help. You can get support in many ways.  Tell your family, friends, and coworkers that you are going to quit and need their support. Ask them not to smoke around you.  Get individual, group, or telephone counseling and support. Programs are available at local hospitals and health centers. Call   your local health department for information about programs in your area.  Spiritual beliefs and practices may help some smokers quit.  Download a "quit meter" on your computer to keep track of quit statistics, such as how long you have gone without smoking, cigarettes not smoked, and money saved.  Get a self-help book about quitting smoking and staying off tobacco. LEARN NEW SKILLS AND BEHAVIORS  Distract yourself from urges to smoke. Talk to someone, go for a walk, or occupy your time with a task.  Change your normal routine. Take a different route to work.  Drink tea instead of coffee. Eat breakfast in a different place.  Reduce your stress. Take a hot bath, exercise, or read a book.  Plan something enjoyable to do every day. Reward yourself for not smoking.  Explore interactive web-based programs that specialize in helping you quit. GET MEDICINE AND USE IT CORRECTLY Medicines can help you stop smoking and decrease the urge to smoke. Combining medicine with the above behavioral methods and support can greatly increase your chances of successfully quitting smoking.  Nicotine replacement therapy helps deliver nicotine to your body without the negative effects and risks of smoking. Nicotine replacement therapy includes nicotine gum, lozenges, inhalers, nasal sprays, and skin patches. Some may be available over-the-counter and others require a prescription.  Antidepressant medicine helps people abstain from smoking, but how this works is unknown. This medicine is available by prescription.  Nicotinic receptor partial agonist medicine simulates the effect of nicotine in your brain. This medicine is available by prescription. Ask your health care provider for advice about which medicines to use and how to use them based on your health history. Your health care provider will tell you what side effects to look out for if you choose to be on a medicine or therapy. Carefully read the information on the package. Do not use any other product containing nicotine while using a nicotine replacement product.  RELAPSE OR DIFFICULT SITUATIONS Most relapses occur within the first 3 months after quitting. Do not be discouraged if you start smoking again. Remember, most people try several times before finally quitting. You may have symptoms of withdrawal because your body is used to nicotine. You may crave cigarettes, be irritable, feel very hungry, cough often, get headaches, or have difficulty concentrating. The withdrawal symptoms are only temporary. They are strongest  when you first quit, but they will go away within 10-14 days. To reduce the chances of relapse, try to:  Avoid drinking alcohol. Drinking lowers your chances of successfully quitting.  Reduce the amount of caffeine you consume. Once you quit smoking, the amount of caffeine in your body increases and can give you symptoms, such as a rapid heartbeat, sweating, and anxiety.  Avoid smokers because they can make you want to smoke.  Do not let weight gain distract you. Many smokers will gain weight when they quit, usually less than 10 pounds. Eat a healthy diet and stay active. You can always lose the weight gained after you quit.  Find ways to improve your mood other than smoking. FOR MORE INFORMATION  www.smokefree.gov  Document Released: 07/05/2001 Document Revised: 11/25/2013 Document Reviewed: 10/20/2011 ExitCare Patient Information 2015 [employer]. This information is not intended to replace advice given to you by your health care provider. Make sure you discuss any questions you have with your health care provider. DASH Eating Plan DASH stands for "Dietary Approaches to Stop Hypertension." The DASH eating plan is a healthy eating plan that has   been shown to reduce high blood pressure (hypertension). Additional health benefits may include reducing the risk of type 2 diabetes mellitus, heart disease, and stroke. The DASH eating plan may also help with weight loss. WHAT DO I NEED TO KNOW ABOUT THE DASH EATING PLAN? For the DASH eating plan, you will follow these general guidelines:  Choose foods with a percent daily value for sodium of less than 5% (as listed on the food label).  Use salt-free seasonings or herbs instead of table salt or sea salt.  Check with your health care provider or pharmacist before using salt substitutes.  Eat lower-sodium products, often labeled as "lower sodium" or "no salt added."  Eat fresh foods.  Eat more vegetables, fruits, and low-fat dairy  products.  Choose whole grains. Look for the word "whole" as the first word in the ingredient list.  Choose fish and skinless chicken or turkey more often than red meat. Limit fish, poultry, and meat to 6 oz (170 g) each day.  Limit sweets, desserts, sugars, and sugary drinks.  Choose heart-healthy fats.  Limit cheese to 1 oz (28 g) per day.  Eat more home-cooked food and less restaurant, buffet, and fast food.  Limit fried foods.  Cook foods using methods other than frying.  Limit canned vegetables. If you do use them, rinse them well to decrease the sodium.  When eating at a restaurant, ask that your food be prepared with less salt, or no salt if possible. WHAT FOODS CAN I EAT? Seek help from a dietitian for individual calorie needs. Grains Whole grain or whole wheat bread. Brown rice. Whole grain or whole wheat pasta. Quinoa, bulgur, and whole grain cereals. Low-sodium cereals. Corn or whole wheat flour tortillas. Whole grain cornbread. Whole grain crackers. Low-sodium crackers. Vegetables Fresh or frozen vegetables (raw, steamed, roasted, or grilled). Low-sodium or reduced-sodium tomato and vegetable juices. Low-sodium or reduced-sodium tomato sauce and paste. Low-sodium or reduced-sodium canned vegetables.  Fruits All fresh, canned (in natural juice), or frozen fruits. Meat and Other Protein Products Ground beef (85% or leaner), grass-fed beef, or beef trimmed of fat. Skinless chicken or turkey. Ground chicken or turkey. Pork trimmed of fat. All fish and seafood. Eggs. Dried beans, peas, or lentils. Unsalted nuts and seeds. Unsalted canned beans. Dairy Low-fat dairy products, such as skim or 1% milk, 2% or reduced-fat cheeses, low-fat ricotta or cottage cheese, or plain low-fat yogurt. Low-sodium or reduced-sodium cheeses. Fats and Oils Tub margarines without trans fats. Light or reduced-fat mayonnaise and salad dressings (reduced sodium). Avocado. Safflower, olive, or canola  oils. Natural peanut or almond butter. Other Unsalted popcorn and pretzels. The items listed above may not be a complete list of recommended foods or beverages. Contact your dietitian for more options. WHAT FOODS ARE NOT RECOMMENDED? Grains White bread. White pasta. White rice. Refined cornbread. Bagels and croissants. Crackers that contain trans fat. Vegetables Creamed or fried vegetables. Vegetables in a cheese sauce. Regular canned vegetables. Regular canned tomato sauce and paste. Regular tomato and vegetable juices. Fruits Dried fruits. Canned fruit in light or heavy syrup. Fruit juice. Meat and Other Protein Products Fatty cuts of meat. Ribs, chicken wings, bacon, sausage, bologna, salami, chitterlings, fatback, hot dogs, bratwurst, and packaged luncheon meats. Salted nuts and seeds. Canned beans with salt. Dairy Whole or 2% milk, cream, half-and-half, and cream cheese. Whole-fat or sweetened yogurt. Full-fat cheeses or blue cheese. Nondairy creamers and whipped toppings. Processed cheese, cheese spreads, or cheese curds. Condiments Onion and garlic salt,   seasoned salt, table salt, and sea salt. Canned and packaged gravies. Worcestershire sauce. Tartar sauce. Barbecue sauce. Teriyaki sauce. Soy sauce, including reduced sodium. Steak sauce. Fish sauce. Oyster sauce. Cocktail sauce. Horseradish. Ketchup and mustard. Meat flavorings and tenderizers. Bouillon cubes. Hot sauce. Tabasco sauce. Marinades. Taco seasonings. Relishes. Fats and Oils Butter, stick margarine, lard, shortening, ghee, and bacon fat. Coconut, palm kernel, or palm oils. Regular salad dressings. Other Pickles and olives. Salted popcorn and pretzels. The items listed above may not be a complete list of foods and beverages to avoid. Contact your dietitian for more information. WHERE CAN I FIND MORE INFORMATION? National Heart, Lung, and [employer] Institute: www.nhlbi.nih.gov/health/health-topics/topics/dash/ Document Released:  06/30/2011 Document Revised: 11/25/2013 Document Reviewed: 05/15/2013 ExitCare Patient Information 2015 [employer]. This information is not intended to replace advice given to you by your health care provider. Make sure you discuss any questions you have with your health care provider.  

## 2014-10-22 NOTE — Progress Notes (Signed)
MRN: [id number] Name: Timothy Sanchez  Sex: male Age: 49 y.o. DOB: 1965-09-07  Allergies: Review of patient's allergies indicates no known allergies.  Chief Complaint  Patient presents with  . Medication Refill    HPI: Patient is 49 y.o. male who has is she of hyperlipidemia, GERD, hypertension comes today requesting refill on his medications, patient is still smoking cigarettes, again counseled patient to quit smoking, previous blood work reviewed with the patient noticed vitamin D deficiency, patient has taken the prescription dosage, is advised to take over-the-counter vitamin D 2000 units daily.   Past Medical History  Diagnosis Date  . Hypertension     History reviewed. No pertinent past surgical history.    Medication List       This list is accurate as of: 10/22/14  3:37 PM.  Always use your most recent med list.               atorvastatin 20 MG tablet  Commonly known as:  LIPITOR  Take 1 tablet (20 mg total) by mouth daily.     losartan 50 MG tablet  Commonly known as:  COZAAR  TAKE 1 TABLET BY MOUTH DAILY.     omeprazole 40 MG capsule  Commonly known as:  PRILOSEC  Take 1 capsule (40 mg total) by mouth daily.     Vitamin D (Ergocalciferol) 50000 UNITS Caps capsule  Commonly known as:  DRISDOL  Take 1 capsule (50,000 Units total) by mouth every 7 (seven) days.        Meds ordered this encounter  Medications  . omeprazole (PRILOSEC) 40 MG capsule    Sig: Take 1 capsule (40 mg total) by mouth daily.    Dispense:  90 capsule    Refill:  1  . losartan (COZAAR) 50 MG tablet    Sig: TAKE 1 TABLET BY MOUTH DAILY.    Dispense:  90 tablet    Refill:  1  . atorvastatin (LIPITOR) 20 MG tablet    Sig: Take 1 tablet (20 mg total) by mouth daily.    Dispense:  90 tablet    Refill:  3    Immunization History  Administered Date(s) Administered  . Influenza,inj,Quad PF,36+ Mos 03/28/2013    History reviewed. No pertinent family history.  History    Substance Use Topics  . Smoking status: Heavy Tobacco Smoker -- 1.50 packs/day for 25 years    Types: Cigarettes    Start date: 03/26/2013  . Smokeless tobacco: Not on file  . Alcohol Use: Not on file    Review of Systems   As noted in HPI  Filed Vitals:   10/22/14 1439  BP: 135/83  Pulse: 104  Temp: 98 F (36.7 C)  Resp: 16    Physical Exam  Physical Exam  Constitutional: No distress.  Eyes: EOM are normal. Pupils are equal, round, and reactive to light.  Cardiovascular: Normal rate and regular rhythm.   Pulmonary/Chest: Breath sounds normal. No respiratory distress. He has no wheezes. He has no rales.  Musculoskeletal: He exhibits no edema.    CBC    Component Value Date/Time   WBC 4.5 03/11/2014 1723   RBC 5.27 03/11/2014 1723   HGB 14.7 03/11/2014 1723   HCT 44.1 03/11/2014 1723   PLT 213 03/11/2014 1723   MCV 83.7 03/11/2014 1723   LYMPHSABS 1.7 03/11/2014 1723   MONOABS 0.4 03/11/2014 1723   EOSABS 0.2 03/11/2014 1723   BASOSABS 0.0 03/11/2014 1723  CMP     Component Value Date/Time   NA 136 03/11/2014 1723   K 4.3 03/11/2014 1723   CL 100 03/11/2014 1723   CO2 28 03/11/2014 1723   GLUCOSE 107* 03/11/2014 1723   BUN 21 03/11/2014 1723   CREATININE 0.85 03/11/2014 1723   CREATININE 0.90 04/14/2010 2206   CALCIUM 8.9 03/11/2014 1723   PROT 6.6 03/11/2014 1723   ALBUMIN 4.3 03/11/2014 1723   AST 19 03/11/2014 1723   ALT 20 03/11/2014 1723   ALKPHOS 50 03/11/2014 1723   BILITOT 0.5 03/11/2014 1723   GFRNONAA >89 03/11/2014 1723   GFRAA >89 03/11/2014 1723    Lab Results  Component Value Date/Time   CHOL 266* 03/11/2014 05:23 PM    No components found for: HGA1C  Lab Results  Component Value Date/Time   AST 19 03/11/2014 05:23 PM    Assessment and Plan  Gastroesophageal reflux disease without esophagitis - Plan:Lifestyle modification, continue with  omeprazole (PRILOSEC) 40 MG capsule  Vitamin D deficiency Patient to start  taking over-the-counter vitamin D 2000 units daily  Essential hypertension - Plan: Advised patient for DASH diet continue with losartan (COZAAR) 50 MG tablet, COMPLETE METABOLIC PANEL WITH GFR, TSH  Nicotine abuse Again counseled patient to quit smoking.  Hyperlipidemia - Plan: atorvastatin (LIPITOR) 20 MG tablet, And will recheck Lipid panel    Return in about 4 months (around 02/21/2015) for hypertension, hyperipidemia.   This note has been created with Surveyor, quantity. Any transcriptional errors are unintentional.    Lorayne Marek, MD

## 2014-11-03 ENCOUNTER — Other Ambulatory Visit: Payer: Self-pay | Admitting: Internal Medicine

## 2014-11-11 ENCOUNTER — Telehealth: Payer: Self-pay | Admitting: *Deleted

## 2014-11-11 NOTE — Telephone Encounter (Signed)
Pt is aware of his lab results. 

## 2014-11-11 NOTE — Telephone Encounter (Signed)
-----   Message from Dorothe Pea, LPN sent at 1/54/0086  1:56 PM EDT -----   ----- Message -----    From: Lorayne Marek, MD    Sent: 10/23/2014  12:09 PM      To: Dorothe Pea, LPN  Blood work reviewed noticed impaired fasting glucose, call and advise patient for low carbohydrate diet. Also his cholesterol is slightly improved but is still elevated, advise patient for low fat diet and increase the dose of Lipitor to 40 mg daily, will repeat fasting lipid panel on next visit.

## 2015-07-13 ENCOUNTER — Other Ambulatory Visit: Payer: Self-pay | Admitting: Internal Medicine

## 2015-08-05 MED FILL — ?OMEPRAZOLE DR 20 MG CAPSUL: 20 | 30 days supply | Qty: 60 | Fill #0

## 2015-08-14 ENCOUNTER — Other Ambulatory Visit: Payer: Self-pay | Admitting: Internal Medicine

## 2015-08-14 MED FILL — LOSARTAN POTASSIUM 50 MG TA: 50 | 30 days supply | Qty: 30 | Fill #0

## 2015-09-02 ENCOUNTER — Other Ambulatory Visit: Payer: Self-pay | Admitting: Internal Medicine

## 2015-09-02 MED FILL — ?OMEPRAZOLE DR 20 MG CAPSUL: 20 | 30 days supply | Qty: 60 | Fill #1

## 2015-09-14 ENCOUNTER — Other Ambulatory Visit: Payer: Self-pay | Admitting: Internal Medicine

## 2015-09-14 ENCOUNTER — Telehealth: Payer: Self-pay | Admitting: Internal Medicine

## 2015-09-14 NOTE — Telephone Encounter (Signed)
Patient came in requesting a medication refill for losartan. Patient is aware that there will be no more refills unless he saw a doctor.

## 2015-09-14 NOTE — Telephone Encounter (Signed)
Number in chart is a non working number Unable to contact patient Per Dr Doreene Burke patient needs an appointment  To have his refills

## 2015-09-15 ENCOUNTER — Ambulatory Visit: Payer: Self-pay | Attending: Family Medicine | Admitting: Family Medicine

## 2015-09-15 ENCOUNTER — Encounter: Payer: Self-pay | Admitting: Family Medicine

## 2015-09-15 VITALS — BP 152/103 | HR 95 | Temp 98.0°F | Resp 15 | Ht 76.0 in | Wt 221.2 lb

## 2015-09-15 DIAGNOSIS — I10 Essential (primary) hypertension: Secondary | ICD-10-CM | POA: Insufficient documentation

## 2015-09-15 DIAGNOSIS — Z131 Encounter for screening for diabetes mellitus: Secondary | ICD-10-CM | POA: Insufficient documentation

## 2015-09-15 DIAGNOSIS — Z79899 Other long term (current) drug therapy: Secondary | ICD-10-CM | POA: Insufficient documentation

## 2015-09-15 DIAGNOSIS — K219 Gastro-esophageal reflux disease without esophagitis: Secondary | ICD-10-CM | POA: Insufficient documentation

## 2015-09-15 DIAGNOSIS — E785 Hyperlipidemia, unspecified: Secondary | ICD-10-CM | POA: Insufficient documentation

## 2015-09-15 DIAGNOSIS — Z9119 Patient's noncompliance with other medical treatment and regimen: Secondary | ICD-10-CM | POA: Insufficient documentation

## 2015-09-15 DIAGNOSIS — E559 Vitamin D deficiency, unspecified: Secondary | ICD-10-CM | POA: Insufficient documentation

## 2015-09-15 LAB — POCT GLYCOSYLATED HEMOGLOBIN (HGB A1C): Hemoglobin A1C: 5.6

## 2015-09-15 MED ORDER — ATORVASTATIN CALCIUM 20 MG PO TABS: 20.0000 mg | ORAL_TABLET | Freq: Every day | ORAL | Status: DC

## 2015-09-15 MED ORDER — LOSARTAN POTASSIUM 50 MG PO TABS: 50.0000 mg | ORAL_TABLET | Freq: Every day | ORAL | Status: DC

## 2015-09-15 MED ORDER — OMEPRAZOLE 20 MG PO CPDR: 40.0000 mg | DELAYED_RELEASE_CAPSULE | Freq: Every day | ORAL | Status: DC

## 2015-09-15 MED FILL — LOSARTAN POTASSIUM 50 MG TA: 50 | 30 days supply | Qty: 30 | Fill #0

## 2015-09-15 MED FILL — ATORVASTATIN 20 MG TABLET: 20 | 30 days supply | Qty: 30 | Fill #0

## 2015-09-15 NOTE — Patient Instructions (Signed)

## 2015-09-15 NOTE — Progress Notes (Signed)
Subjective:  Patient ID: Timothy Sanchez, male    DOB: 03-May-1966  Age: 50 y.o. MRN: [id number]  CC: Hypertension   HPI Timothy Sanchez is a 50 year old male with a history of hypertension, hyperlipidemia, GERD who presents for follow-up visit.  He has been out of his antihypertensive hence elevated blood pressure today.  He has been taking his statin every other day rather than daily as prescribed and has not been compliant with exercise regimen or low-cholesterol diet but tries to eat a low-sodium diet. He complains of reflux symptoms which are not controlled on omeprazole and is wondering if he can get something stronger however he admits to late night meals and goes to bed shortly after eating.   denies constipation, diarrhea , nausea or vomiting.  Review of his med list indicates he was on vitamin D replacement for vitamin D deficiency but he is not currently taking this at this time.  Outpatient Prescriptions Prior to Visit  Medication Sig Dispense Refill  . omeprazole (PRILOSEC) 20 MG capsule Take 2 capsules (40 mg total) by mouth daily. Needs DR appt for refills 180 capsule 0  . Vitamin D, Ergocalciferol, (DRISDOL) 50000 UNITS CAPS capsule Take 1 capsule (50,000 Units total) by mouth every 7 (seven) days. (Patient not taking: Reported on 09/15/2015) 12 capsule 0  . atorvastatin (LIPITOR) 20 MG tablet Take 1 tablet (20 mg total) by mouth daily. (Patient not taking: Reported on 09/15/2015) 90 tablet 3  . losartan (COZAAR) 50 MG tablet Take 1 tablet (50 mg total) by mouth daily. Must have office visit for more refills (Patient not taking: Reported on 09/15/2015) 30 tablet 0   No facility-administered medications prior to visit.    ROS Review of Systems  Constitutional: Negative for activity change and appetite change.  HENT: Negative for sinus pressure and sore throat.   Eyes: Negative for visual disturbance.  Respiratory: Negative for cough, chest tightness and shortness of  breath.   Cardiovascular: Negative for chest pain and leg swelling.  Gastrointestinal: Negative for abdominal pain, diarrhea, constipation and abdominal distention.       Reflux  Endocrine: Negative.   Genitourinary: Negative for dysuria.  Musculoskeletal: Negative for myalgias and joint swelling.  Skin: Negative for rash.  Allergic/Immunologic: Negative.   Neurological: Negative for weakness, light-headedness and numbness.  Psychiatric/Behavioral: Negative for suicidal ideas and dysphoric mood.    Objective:  BP 152/103 mmHg  Pulse 95  Temp(Src) 98 F (36.7 C)  Resp 15  Ht 6\' 4"  (1.93 m)  Wt 221 lb 3.2 oz (100.336 kg)  BMI 26.94 kg/m2  SpO2 93%  BP/Weight 09/15/2015 10/22/2014 [id number]  Systolic BP 0000000 [id number]  Diastolic BP [id number] 83 90  Wt. (Lbs) 221.2 225 225.8  BMI 26.94 27.4 27.5      Physical Exam  Constitutional: He is oriented to person, place, and time. He appears well-developed and well-nourished.  HENT:  Head: Normocephalic and atraumatic.  Right Ear: External ear normal.  Left Ear: External ear normal.  Mouth/Throat: Oropharynx is clear and moist.  Neck: Normal range of motion. Neck supple. No tracheal deviation present.  Cardiovascular: Normal rate, regular rhythm and normal heart sounds.   No murmur heard. Pulmonary/Chest: Effort normal and breath sounds normal. No respiratory distress. He has no wheezes. He exhibits no tenderness.  Abdominal: Soft. Bowel sounds are normal. He exhibits no mass. There is no tenderness.  Musculoskeletal: Normal range of motion. He exhibits no edema or tenderness.  Neurological:  He is alert and oriented to person, place, and time.  Skin: Skin is warm and dry.  Psychiatric: He has a normal mood and affect.     Assessment & Plan:   1. Diabetes mellitus screening  A1c 5.6 - HgB A1c  2. Hyperlipidemia  uncontrolled.  he has been noncompliant with atorvastatin and so if lipid to elevated I will hold off on making any  regimen changes - atorvastatin (LIPITOR) 20 MG tablet; Take 1 tablet (20 mg total) by mouth daily.  Dispense: 90 tablet; Refill: 3  3. Essential hypertension, benign   Uncontrolled due to running out of antihypertensive - losartan (COZAAR) 50 MG tablet; Take 1 tablet (50 mg total) by mouth daily. Must have office visit for more refills  Dispense: 30 tablet; Refill: 0 - COMPLETE METABOLIC PANEL WITH GFR; Future - Lipid panel; Future  4. Gastroesophageal reflux disease without esophagitis Current worsening of symptoms due to late night eating and he has been advised to eat at least 2 hours prior to bedtime - omeprazole (PRILOSEC) 20 MG capsule; Take 2 capsules (40 mg total) by mouth daily. Needs DR appt for refills  Dispense: 180 capsule; Refill: 0  5. Vitamin D deficiency  I'll send off vitamin D levels. He is not currently on any replacement. - Vitamin D, 25-hydroxy; Future   Meds ordered this encounter  Medications  . losartan (COZAAR) 50 MG tablet    Sig: Take 1 tablet (50 mg total) by mouth daily. Must have office visit for more refills    Dispense:  30 tablet    Refill:  0  . atorvastatin (LIPITOR) 20 MG tablet    Sig: Take 1 tablet (20 mg total) by mouth daily.    Dispense:  90 tablet    Refill:  3  . omeprazole (PRILOSEC) 20 MG capsule    Sig: Take 2 capsules (40 mg total) by mouth daily. Needs DR appt for refills    Dispense:  180 capsule    Refill:  0    Patient needs DR appt for refills    Follow-up: Return in about 3 months (around 12/13/2015) for Follow-up on hyperlipidemia.   Arnoldo Morale MD

## 2015-09-15 NOTE — Progress Notes (Signed)
States he has been out of his HTN meds for months He is asking for something different than omeprazole-he says it is not working

## 2015-09-21 ENCOUNTER — Other Ambulatory Visit: Payer: Self-pay

## 2015-09-28 ENCOUNTER — Ambulatory Visit: Payer: Self-pay | Attending: Internal Medicine

## 2015-09-28 ENCOUNTER — Other Ambulatory Visit: Payer: Self-pay | Admitting: *Deleted

## 2015-09-28 DIAGNOSIS — E559 Vitamin D deficiency, unspecified: Secondary | ICD-10-CM

## 2015-09-28 DIAGNOSIS — I10 Essential (primary) hypertension: Secondary | ICD-10-CM

## 2015-09-28 NOTE — Patient Instructions (Signed)
Patient instructed on receiving a FU with results.

## 2015-10-13 ENCOUNTER — Other Ambulatory Visit: Payer: Self-pay | Admitting: Family Medicine

## 2015-10-13 MED FILL — ATORVASTATIN 20 MG TABLET: 20 | 30 days supply | Qty: 30 | Fill #1

## 2015-10-13 MED FILL — OMEPRAZOLE DR 20 MG CAPSULE: 20 | 30 days supply | Qty: 60 | Fill #2

## 2015-10-13 MED FILL — LOSARTAN POTASSIUM 50 MG TA: 50 | 30 days supply | Qty: 30 | Fill #0

## 2015-11-09 MED FILL — ?ATORVASTATIN 20 MG TABLET: 20 | 30 days supply | Qty: 30 | Fill #2

## 2015-11-09 MED FILL — ?OMEPRAZOLE DR 20 MG CAPSUL: 20 | 30 days supply | Qty: 60 | Fill #0

## 2015-11-09 MED FILL — LOSARTAN POTASSIUM 50 MG TA: 50 | 30 days supply | Qty: 30 | Fill #1

## 2015-12-10 MED FILL — ATORVASTATIN 20 MG TABLET: 20 | 30 days supply | Qty: 30 | Fill #3

## 2015-12-10 MED FILL — LOSARTAN POTASSIUM 50 MG TA: 50 | 30 days supply | Qty: 30 | Fill #2

## 2015-12-10 MED FILL — OMEPRAZOLE DR 20 MG CAPSULE: 20 | 30 days supply | Qty: 60 | Fill #1

## 2016-01-11 ENCOUNTER — Other Ambulatory Visit: Payer: Self-pay | Admitting: Family Medicine

## 2016-01-11 MED FILL — ATORVASTATIN 20 MG TABLET: 20 | 30 days supply | Qty: 30 | Fill #4

## 2016-01-11 MED FILL — OMEPRAZOLE DR 20 MG CAPSULE: 20 | 30 days supply | Qty: 60 | Fill #2

## 2016-01-11 MED FILL — LOSARTAN POTASSIUM 50 MG TA: 50 | 30 days supply | Qty: 30 | Fill #0

## 2016-02-08 MED FILL — ATORVASTATIN 20 MG TABLET: 20 | 30 days supply | Qty: 30 | Fill #5

## 2016-02-09 ENCOUNTER — Other Ambulatory Visit: Payer: Self-pay | Admitting: Family Medicine

## 2016-02-09 ENCOUNTER — Telehealth: Payer: Self-pay | Admitting: Internal Medicine

## 2016-02-09 DIAGNOSIS — E785 Hyperlipidemia, unspecified: Secondary | ICD-10-CM

## 2016-02-09 MED ORDER — ATORVASTATIN CALCIUM 20 MG PO TABS: 20.0000 mg | ORAL_TABLET | Freq: Every day | ORAL | Status: DC

## 2016-02-09 MED FILL — ?OMEPRAZOLE DR 20 MG CAPSUL: 20 | 30 days supply | Qty: 60 | Fill #0

## 2016-02-09 MED FILL — LOSARTAN POTASSIUM 50 MG TA: 50 | 30 days supply | Qty: 30 | Fill #0

## 2016-02-09 NOTE — Telephone Encounter (Signed)
Requested medications were refilled- patient needs office visit.

## 2016-02-09 NOTE — Telephone Encounter (Signed)
Patient came in requesting medication lipitor, losartan and prilosec

## 2016-03-08 ENCOUNTER — Other Ambulatory Visit: Payer: Self-pay | Admitting: Family Medicine

## 2016-03-08 MED FILL — LOSARTAN POTASSIUM 50 MG TA: 50 | 30 days supply | Qty: 30 | Fill #1

## 2016-03-08 MED FILL — ?ATORVASTATIN 20 MG TABLET: 20 | 30 days supply | Qty: 30 | Fill #6

## 2016-03-09 ENCOUNTER — Other Ambulatory Visit: Payer: Self-pay | Admitting: Family Medicine

## 2016-03-25 ENCOUNTER — Other Ambulatory Visit: Payer: Self-pay | Admitting: Family Medicine

## 2016-03-25 ENCOUNTER — Telehealth: Payer: Self-pay | Admitting: Family Medicine

## 2016-03-25 NOTE — Telephone Encounter (Signed)
Pt came to the office to request a refill for omeprazole (PRILOSEC) 20 MG capsule. Pt tried to schedule an appointment to establish but we are booked. He will call us on 9/11 to schedule an appt.   Thank you

## 2016-03-29 MED ORDER — OMEPRAZOLE 20 MG PO CPDR: DELAYED_RELEASE_CAPSULE | ORAL | 0 refills | Status: DC

## 2016-03-29 MED FILL — ?OMEPRAZOLE DR 20 MG CAPSUL: 20 | 30 days supply | Qty: 60 | Fill #0

## 2016-03-29 NOTE — Telephone Encounter (Signed)
Done

## 2016-03-30 NOTE — Telephone Encounter (Signed)
I left pt message informing him that PCP has refilled his medication.

## 2016-03-31 ENCOUNTER — Other Ambulatory Visit: Payer: Self-pay | Admitting: Family Medicine

## 2016-04-06 ENCOUNTER — Other Ambulatory Visit: Payer: Self-pay | Admitting: Family Medicine

## 2016-04-06 ENCOUNTER — Telehealth: Payer: Self-pay | Admitting: Family Medicine

## 2016-04-06 MED ORDER — LOSARTAN POTASSIUM 50 MG PO TABS: 50.0000 mg | ORAL_TABLET | Freq: Every day | ORAL | 0 refills | Status: DC

## 2016-04-06 MED FILL — LOSARTAN POTASSIUM 50 MG TA: 50 | 30 days supply | Qty: 30 | Fill #0

## 2016-04-06 NOTE — Telephone Encounter (Signed)
Pt needs refill for bp medication. Please follow up.   Thank you

## 2016-04-06 NOTE — Telephone Encounter (Signed)
Refilled losartan for blood pressure - patient must have office visit for further refills.

## 2016-04-08 MED FILL — ATORVASTATIN 20 MG TABLET: 20 | 30 days supply | Qty: 30 | Fill #0

## 2016-04-29 ENCOUNTER — Other Ambulatory Visit: Payer: Self-pay | Admitting: Family Medicine

## 2016-05-02 ENCOUNTER — Telehealth: Payer: Self-pay | Admitting: Family Medicine

## 2016-05-02 DIAGNOSIS — E785 Hyperlipidemia, unspecified: Secondary | ICD-10-CM

## 2016-05-02 MED ORDER — ATORVASTATIN CALCIUM 20 MG PO TABS: 20.0000 mg | ORAL_TABLET | Freq: Every day | ORAL | 0 refills | Status: DC

## 2016-05-02 MED ORDER — LOSARTAN POTASSIUM 50 MG PO TABS: 50.0000 mg | ORAL_TABLET | Freq: Every day | ORAL | 0 refills | Status: DC

## 2016-05-02 MED FILL — LOSARTAN POTASSIUM 50 MG TA: 50 | 30 days supply | Qty: 30 | Fill #0

## 2016-05-02 MED FILL — ATORVASTATIN 20 MG TABLET: 20 | 30 days supply | Qty: 30 | Fill #0

## 2016-05-02 NOTE — Telephone Encounter (Signed)
Patient needs atorvastatin and lozartan. Please follow up.

## 2016-05-02 NOTE — Telephone Encounter (Signed)
Refilled requested medications but patient must have an office visit for further refills!

## 2016-05-16 ENCOUNTER — Ambulatory Visit: Payer: Self-pay | Attending: Family Medicine | Admitting: Family Medicine

## 2016-05-16 ENCOUNTER — Encounter: Payer: Self-pay | Admitting: Family Medicine

## 2016-05-16 VITALS — BP 128/80 | HR 105 | Temp 98.7°F | Ht 73.0 in | Wt 214.2 lb

## 2016-05-16 DIAGNOSIS — I10 Essential (primary) hypertension: Secondary | ICD-10-CM

## 2016-05-16 DIAGNOSIS — Z23 Encounter for immunization: Secondary | ICD-10-CM

## 2016-05-16 DIAGNOSIS — E785 Hyperlipidemia, unspecified: Secondary | ICD-10-CM

## 2016-05-16 DIAGNOSIS — Z720 Tobacco use: Secondary | ICD-10-CM

## 2016-05-16 DIAGNOSIS — K219 Gastro-esophageal reflux disease without esophagitis: Secondary | ICD-10-CM

## 2016-05-16 DIAGNOSIS — Z79899 Other long term (current) drug therapy: Secondary | ICD-10-CM | POA: Insufficient documentation

## 2016-05-16 MED ORDER — ATORVASTATIN CALCIUM 20 MG PO TABS: 20.0000 mg | ORAL_TABLET | Freq: Every day | ORAL | 5 refills | Status: DC

## 2016-05-16 MED ORDER — RANITIDINE HCL 150 MG PO TABS: 150.0000 mg | ORAL_TABLET | Freq: Two times a day (BID) | ORAL | 5 refills | Status: DC

## 2016-05-16 MED ORDER — LOSARTAN POTASSIUM 50 MG PO TABS: 50.0000 mg | ORAL_TABLET | Freq: Every day | ORAL | 5 refills | Status: DC

## 2016-05-16 MED FILL — raNITIdine HCL 150 MG TABS: 150 | 30 days supply | Qty: 60 | Fill #0

## 2016-05-16 NOTE — Patient Instructions (Signed)

## 2016-05-16 NOTE — Progress Notes (Signed)
Subjective:  Patient ID: Timothy Sanchez, male    DOB: 08/10/1965  Age: 50 y.o. MRN: [id number]  CC: Hypertension and Gastroesophageal Reflux (meds not working- "lots of gas")   HPI Timothy Sanchez is a 50 year old male with a history of hypertension, tobacco abuse, GERD, hyperlipidemia who presents today for follow-up visit.  He has been compliant with his antihypertensive and low-sodium diet, also taking his statin and tolerating it well. Complains of excessive bloating and gassiness in his stomach whenever he takes omeprazole but he also endorses lying down soon after meals. Denies nausea, vomiting, diarrhea or constipation.  He continues to smoke 1-1/2 packs of cigarettes per day and is not ready to quit yet.  Denies chest pain, shortness of breath, pedal edema and is willing to receive the flu shot today.  Past Medical History:  Diagnosis Date  . Hypertension     History reviewed. No pertinent surgical history.  No Known Allergies   Outpatient Medications Prior to Visit  Medication Sig Dispense Refill  . atorvastatin (LIPITOR) 20 MG tablet Take 1 tablet (20 mg total) by mouth daily. 30 tablet 0  . losartan (COZAAR) 50 MG tablet Take 1 tablet (50 mg total) by mouth daily. 30 tablet 0  . Vitamin D, Ergocalciferol, (DRISDOL) 50000 UNITS CAPS capsule Take 1 capsule (50,000 Units total) by mouth every 7 (seven) days. (Patient not taking: Reported on 05/16/2016) 12 capsule 0  . omeprazole (PRILOSEC) 20 MG capsule TAKE 2 CAPSULES BY MOUTH DAILY. (Patient not taking: Reported on 05/16/2016) 60 capsule 0   No facility-administered medications prior to visit.     ROS Review of Systems  Constitutional: Negative for activity change and appetite change.  HENT: Negative for sinus pressure and sore throat.   Eyes: Negative for visual disturbance.  Respiratory: Negative for cough, chest tightness and shortness of breath.   Cardiovascular: Negative for chest pain and leg swelling.    Gastrointestinal: Negative for abdominal distention, abdominal pain, constipation and diarrhea.       Abdominal bloating and gas  Endocrine: Negative.   Genitourinary: Negative for dysuria.  Musculoskeletal: Negative for joint swelling and myalgias.  Skin: Negative for rash.  Allergic/Immunologic: Negative.   Neurological: Negative for weakness, light-headedness and numbness.  Psychiatric/Behavioral: Negative for dysphoric mood and suicidal ideas.    Objective:  BP 128/80 (BP Location: Right Arm, Patient Position: Sitting, Cuff Size: Large)   Pulse (!) 105   Temp 98.7 F (37.1 C) (Oral)   Ht 6\' 1"  (1.854 m)   Wt 214 lb 3.2 oz (97.2 kg)   SpO2 95%   BMI 28.26 kg/m   BP/Weight 05/16/2016 09/15/2015 [id number]  Systolic BP 0000000 0000000 [id number]  Diastolic BP 80 [id number] 83  Wt. (Lbs) 214.2 221.2 225  BMI 28.26 26.94 27.4      Physical Exam  Constitutional: He is oriented to person, place, and time. He appears well-developed and well-nourished.  Neck: No JVD present.  Cardiovascular: Normal rate, normal heart sounds and intact distal pulses.   No murmur heard. Pulmonary/Chest: Effort normal and breath sounds normal. He has no wheezes. He has no rales. He exhibits no tenderness.  Abdominal: Soft. Bowel sounds are normal. He exhibits no distension and no mass. There is no tenderness.  Musculoskeletal: Normal range of motion.  Neurological: He is alert and oriented to person, place, and time.  Skin: Skin is warm and dry.  Psychiatric: He has a normal mood and affect.     CMP  Latest Ref Rng & Units 10/22/2014 03/11/2014 03/28/2013  Glucose 70 - 99 mg/dL 104(H) 107(H) 99  BUN 6 - 23 mg/dL 14 21 20   Creatinine 0.50 - 1.35 mg/dL 0.75 0.85 0.77  Sodium 135 - 145 mEq/L 135 136 134(L)  Potassium 3.5 - 5.3 mEq/L 4.5 4.3 4.7  Chloride 96 - 112 mEq/L 100 100 101  CO2 19 - 32 mEq/L 27 28 25   Calcium 8.4 - 10.5 mg/dL 9.9 8.9 9.1  Total Protein 6.0 - 8.3 g/dL 6.6 6.6 -  Total Bilirubin 0.2 - 1.2  mg/dL 0.3 0.5 -  Alkaline Phos 39 - 117 U/L 55 50 -  AST 0 - 37 U/L 17 19 -  ALT 0 - 53 U/L 21 20 -    Lipid Panel     Component Value Date/Time   CHOL 224 (H) 10/22/2014 1517   TRIG 125 10/22/2014 1517   HDL 39 (L) 10/22/2014 1517   CHOLHDL 5.7 10/22/2014 1517   VLDL 25 10/22/2014 1517   LDLCALC 160 (H) 10/22/2014 1517    Assessment & Plan:   1. Hyperlipidemia, unspecified hyperlipidemia type Uncontrolled Low-cholesterol diet We'll recheck cholesterol levels - atorvastatin (LIPITOR) 20 MG tablet; Take 1 tablet (20 mg total) by mouth daily.  Dispense: 30 tablet; Refill: 5  2. Essential hypertension Controlled Low-sodium DASH diet - losartan (COZAAR) 50 MG tablet; Take 1 tablet (50 mg total) by mouth daily.  Dispense: 30 tablet; Refill: 5 - COMPLETE METABOLIC PANEL WITH GFR; Future - Lipid panel; Future  3. Gastroesophageal reflux disease without esophagitis Switched to ranitidine as he is unable to tolerate omeprazole - ranitidine (ZANTAC) 150 MG tablet; Take 1 tablet (150 mg total) by mouth 2 (two) times daily.  Dispense: 60 tablet; Refill: 5  4. Encounter for immunization - Flu Vaccine QUAD 36+ mos IM  5. Tobacco abuse Spent 3 minutes counseling on health effects of tobacco use and its products and he is not ready to quit at this time.  Meds ordered this encounter  Medications  . atorvastatin (LIPITOR) 20 MG tablet    Sig: Take 1 tablet (20 mg total) by mouth daily.    Dispense:  30 tablet    Refill:  5  . losartan (COZAAR) 50 MG tablet    Sig: Take 1 tablet (50 mg total) by mouth daily.    Dispense:  30 tablet    Refill:  5    Must have office visit for refills!!!!!!!  . ranitidine (ZANTAC) 150 MG tablet    Sig: Take 1 tablet (150 mg total) by mouth 2 (two) times daily.    Dispense:  60 tablet    Refill:  5    Follow-up: Return in about 6 months (around 11/14/2016) for Follow-up on GERD.   Arnoldo Morale MD

## 2016-05-18 ENCOUNTER — Ambulatory Visit: Payer: Self-pay | Attending: Internal Medicine

## 2016-05-18 DIAGNOSIS — I10 Essential (primary) hypertension: Secondary | ICD-10-CM

## 2016-05-18 LAB — COMPLETE METABOLIC PANEL WITH GFR
ALT: 21 U/L (ref 9–46)
AST: 26 U/L (ref 10–40)
Albumin: 3.7 g/dL (ref 3.6–5.1)
Alkaline Phosphatase: 54 U/L (ref 40–115)
BUN: 13 mg/dL (ref 7–25)
CHLORIDE: 102 mmol/L (ref 98–110)
CO2: 27 mmol/L (ref 20–31)
Calcium: 8.8 mg/dL (ref 8.6–10.3)
Creat: 0.87 mg/dL (ref 0.60–1.35)
Glucose, Bld: 98 mg/dL (ref 65–99)
POTASSIUM: 4.3 mmol/L (ref 3.5–5.3)
Sodium: 136 mmol/L (ref 135–146)
Total Bilirubin: 0.4 mg/dL (ref 0.2–1.2)
Total Protein: 6.1 g/dL (ref 6.1–8.1)

## 2016-05-18 LAB — LIPID PANEL
CHOL/HDL RATIO: 2 ratio (ref <=5.0)
CHOLESTEROL: 138 mg/dL (ref 125–200)
HDL: 68 mg/dL (ref >=40)
LDL Cholesterol: 54 mg/dL (ref <130)
TRIGLYCERIDES: 82 mg/dL (ref <150)
VLDL: 16 mg/dL (ref <30)

## 2016-05-18 NOTE — Progress Notes (Signed)
Patient here for lab visit only 

## 2016-05-24 ENCOUNTER — Telehealth: Payer: Self-pay

## 2016-05-24 NOTE — Telephone Encounter (Addendum)
Patient fully hipaa verified. Rn advised patient per Dr. Jarold Song: labs are normal.   Patient verbalized understanding. No further questions at this time.  Priscille Heidelberg, RN, BSN

## 2016-06-08 ENCOUNTER — Other Ambulatory Visit: Payer: Self-pay | Admitting: Family Medicine

## 2016-06-08 MED FILL — LOSARTAN POTASSIUM 50 MG TA: 50 | 30 days supply | Qty: 30 | Fill #0

## 2016-06-09 ENCOUNTER — Telehealth: Payer: Self-pay | Admitting: Family Medicine

## 2016-06-09 NOTE — Telephone Encounter (Signed)
Patient would like to be mailed a copy of recent lab results. Please follow up.

## 2016-06-10 NOTE — Telephone Encounter (Signed)
Writer mailed patient his requested lab results.

## 2016-07-07 MED FILL — LOSARTAN POTASSIUM 50 MG TA: 50 | 30 days supply | Qty: 30 | Fill #1

## 2016-07-07 MED FILL — ?RANITIDINE 150 MG TABLET: 150 MG | 30 days supply | Qty: 60 | Fill #1

## 2016-08-03 MED FILL — raNITIdine HCL 150 MG TABS: 150 | 30 days supply | Qty: 60 | Fill #2

## 2016-08-03 MED FILL — ATORVASTATIN 20 MG TABLET: 20 | 30 days supply | Qty: 30 | Fill #0

## 2016-08-03 MED FILL — LOSARTAN POTASSIUM 50 MG TA: 50 | 30 days supply | Qty: 30 | Fill #2

## 2016-09-01 MED FILL — LOSARTAN POTASSIUM 50 MG TA: 50 | 30 days supply | Qty: 30 | Fill #3

## 2016-09-01 MED FILL — ATORVASTATIN 20 MG TABLET: 20 | 30 days supply | Qty: 30 | Fill #1

## 2016-10-03 MED FILL — ?RANITIDINE 150 MG TABLET: 150 MG | 30 days supply | Qty: 60 | Fill #3

## 2016-10-03 MED FILL — ATORVASTATIN 20 MG TABLET: 20 | 30 days supply | Qty: 30 | Fill #2

## 2016-10-03 MED FILL — ?LOSARTAN POTASSIUM 50 MG T: 50 MG | 30 days supply | Qty: 30 | Fill #4

## 2016-10-18 ENCOUNTER — Emergency Department (HOSPITAL_COMMUNITY)
Admission: EM | Admit: 2016-10-18 | Discharge: 2016-10-18 | Disposition: A | Discharge status: Home / Self Care | Payer: Self-pay | Attending: Emergency Medicine | Admitting: Emergency Medicine

## 2016-10-18 ENCOUNTER — Encounter (HOSPITAL_COMMUNITY): Payer: Self-pay

## 2016-10-18 DIAGNOSIS — Z79899 Other long term (current) drug therapy: Secondary | ICD-10-CM | POA: Insufficient documentation

## 2016-10-18 DIAGNOSIS — I10 Essential (primary) hypertension: Secondary | ICD-10-CM | POA: Insufficient documentation

## 2016-10-18 DIAGNOSIS — M5441 Lumbago with sciatica, right side: Secondary | ICD-10-CM | POA: Insufficient documentation

## 2016-10-18 DIAGNOSIS — F17210 Nicotine dependence, cigarettes, uncomplicated: Secondary | ICD-10-CM | POA: Insufficient documentation

## 2016-10-18 DIAGNOSIS — M5431 Sciatica, right side: Secondary | ICD-10-CM

## 2016-10-18 MED ORDER — METHOCARBAMOL 500 MG PO TABS: 500.0000 mg | ORAL_TABLET | Freq: Four times a day (QID) | ORAL | 0 refills | Status: DC

## 2016-10-18 MED ORDER — NAPROXEN 500 MG PO TABS: 500.0000 mg | ORAL_TABLET | Freq: Two times a day (BID) | ORAL | 0 refills | Status: DC

## 2016-10-18 NOTE — Discharge Instructions (Signed)
Please read and follow all provided instructions.  Your diagnoses today include:  1. Sciatica of right side     Tests performed today include:  Vital signs - see below for your results today  Medications prescribed:   Robaxin (methocarbamol) - muscle relaxer medication  DO NOT drive or perform any activities that require you to be awake and alert because this medicine can make you drowsy.    Naproxen - anti-inflammatory pain medication  Do not exceed 500mg  naproxen every 12 hours, take with food  You have been prescribed an anti-inflammatory medication or NSAID. Take with food. Take smallest effective dose for the shortest duration needed for your pain. Stop taking if you experience stomach pain or vomiting.   Take any prescribed medications only as directed.  Home care instructions:   Follow any educational materials contained in this packet  Please rest, use ice or heat on your back for the next several days  Do not lift, push, pull anything more than 10 pounds for the next week  Follow-up instructions: Please follow-up with your primary care provider in the next 1 week for further evaluation of your symptoms.   Return instructions:  SEEK IMMEDIATE MEDICAL ATTENTION IF YOU HAVE:  New numbness, tingling, weakness, or problem with the use of your arms or legs  Severe back pain not relieved with medications  Loss control of your bowels or bladder  Increasing pain in any areas of the body (such as chest or abdominal pain)  Shortness of breath, dizziness, or fainting.   Worsening nausea (feeling sick to your stomach), vomiting, fever, or sweats  Any other emergent concerns regarding your health   Additional Information:  Your vital signs today were: Pulse 99    Temp 98.1 F (36.7 C) (Oral)    Resp 16    SpO2 97%  If your blood pressure (BP) was elevated above 135/85 this visit, please have this repeated by your doctor within one month. --------------

## 2016-10-18 NOTE — ED Provider Notes (Signed)
[facility] DEPT Provider Note   CSN: [id number] Arrival date & time: 10/18/16  1257     History   Chief Complaint Chief Complaint  Patient presents with  . Back Pain    HPI Timothy Sanchez is a 51 y.o. male.  Patient with history of hypertension presents with one-week history of low back pain, worsening today with radiation now down his right buttocks and posterior right leg. No weakness. No difficulty with walking. Pain is worse when changing positions. No treatments prior to arrival. Patient denies warning symptoms of back pain including: fecal incontinence, urinary retention or overflow incontinence, night sweats, waking from sleep with back pain, unexplained fevers or weight loss, h/o cancer, IVDU, recent trauma. Patient does a lot of heavy lifting at his job.       Past Medical History:  Diagnosis Date  . Hypertension     Patient Active Problem List   Diagnosis Date Noted  . Vitamin D deficiency 09/15/2015  . Hyperlipidemia 09/15/2015  . Hypertriglyceridemia 03/11/2014  . Essential hypertension 03/11/2014  . Essential hypertension, benign 03/28/2013  . GERD (gastroesophageal reflux disease) 03/28/2013  . Nicotine abuse 03/28/2013    History reviewed. No pertinent surgical history.     Home Medications    Prior to Admission medications   Medication Sig Start Date End Date Taking? Authorizing Provider  atorvastatin (LIPITOR) 20 MG tablet Take 1 tablet (20 mg total) by mouth daily. 05/16/16   Arnoldo Morale, MD  losartan (COZAAR) 50 MG tablet Take 1 tablet (50 mg total) by mouth daily. 05/16/16   Arnoldo Morale, MD  methocarbamol (ROBAXIN) 500 MG tablet Take 1 tablet (500 mg total) by mouth 4 (four) times daily. 10/18/16   Carlisle Cater, PA-C  naproxen (NAPROSYN) 500 MG tablet Take 1 tablet (500 mg total) by mouth 2 (two) times daily. 10/18/16   Carlisle Cater, PA-C  ranitidine (ZANTAC) 150 MG tablet Take 1 tablet (150 mg total) by mouth 2 (two) times daily.  05/16/16   Arnoldo Morale, MD  Vitamin D, Ergocalciferol, (DRISDOL) 50000 UNITS CAPS capsule Take 1 capsule (50,000 Units total) by mouth every 7 (seven) days. Patient not taking: Reported on 05/16/2016 03/13/14   Lorayne Marek, MD    Family History History reviewed. No pertinent family history.  Social History Social History  Substance Use Topics  . Smoking status: Heavy Tobacco Smoker    Packs/day: 1.50    Years: 25.00    Types: Cigarettes    Start date: 03/26/2013  . Smokeless tobacco: Never Used  . Alcohol use 4.8 oz/week    7 Shots of liquor, 1 Cans of beer per week     Comment: daily     Allergies   Patient has no known allergies.   Review of Systems Review of Systems  Constitutional: Negative for fever and unexpected weight change.  Gastrointestinal: Negative for constipation.       Neg for fecal incontinence  Genitourinary: Negative for difficulty urinating, flank pain and hematuria.       Negative for urinary incontinence or retention  Musculoskeletal: Positive for back pain.  Neurological: Negative for weakness and numbness.       Negative for saddle paresthesias      Physical Exam Updated Vital Signs Pulse 99   Temp 98.1 F (36.7 C) (Oral)   Resp 16   SpO2 97%   Physical Exam  Constitutional: He appears well-developed and well-nourished.  HENT:  Head: Normocephalic and atraumatic.  Eyes: Conjunctivae are normal.  Neck: Normal range of motion.  Abdominal: Soft. There is no tenderness. There is no CVA tenderness.  Musculoskeletal: Normal range of motion.       Cervical back: He exhibits normal range of motion, no tenderness and no bony tenderness.       Thoracic back: He exhibits normal range of motion, no tenderness and no bony tenderness.       Lumbar back: He exhibits tenderness. He exhibits normal range of motion and no bony tenderness.  No step-off noted with palpation of spine.   Neurological: He is alert. He has normal reflexes. No sensory  deficit. He exhibits normal muscle tone.  5/5 strength in entire lower extremities bilaterally. No sensation deficit.   Skin: Skin is warm and dry.  Psychiatric: He has a normal mood and affect.  Nursing note and vitals reviewed.    ED Treatments / Results   Procedures Procedures (including critical care time)  Medications Ordered in ED Medications - No data to display   Initial Impression / Assessment and Plan / ED Course  I have reviewed the triage vital signs and the nursing notes.  Pertinent labs & imaging results that were available during my care of the patient were reviewed by me and considered in my medical decision making (see chart for details).     2:10 PM Patient seen and examined.  Vital signs reviewed and are as follows: Vitals:   10/18/16 1333  Pulse: 99  Resp: 16  Temp: 98.1 F (36.7 C)    No red flag s/s of low back pain. Patient was counseled on back pain precautions and told to do activity as tolerated but do not lift, push, or pull heavy objects more than 10 pounds for the next week.  Patient counseled to use ice or heat on back for no longer than 15 minutes every hour.   Patient counseled on proper use of muscle relaxant medication.  They were told not to drink alcohol, drive any vehicle, or do any dangerous activities while taking this medication.  Patient verbalized understanding.  Patient urged to follow-up with PCP if pain does not improve with treatment and rest or if pain becomes recurrent. Urged to return with worsening severe pain, loss of bowel or bladder control, trouble walking.   The patient verbalizes understanding and agrees with the plan.     Final Clinical Impressions(s) / ED Diagnoses   Final diagnoses:  Sciatica of right side   Patient with back pain with radicular features. No neurological deficits. Patient is ambulatory. No warning symptoms of back pain including: fecal incontinence, urinary retention or overflow incontinence,  night sweats, waking from sleep with back pain, unexplained fevers or weight loss, h/o cancer, IVDU, recent trauma. No concern for cauda equina, epidural abscess, or other serious cause of back pain. Conservative measures such as rest, ice/heat and pain medicine indicated with PCP follow-up if no improvement with conservative management.     New Prescriptions Current Discharge Medication List    START taking these medications   Details  methocarbamol (ROBAXIN) 500 MG tablet Take 1 tablet (500 mg total) by mouth 4 (four) times daily. Qty: 20 tablet, Refills: 0    naproxen (NAPROSYN) 500 MG tablet Take 1 tablet (500 mg total) by mouth 2 (two) times daily. Qty: 20 tablet, Refills: 0         Carlisle Cater, PA-C 10/18/16 1411    Gwenyth Allegra Tegeler, MD 10/19/16 1004

## 2016-10-18 NOTE — ED Triage Notes (Signed)
Pt states lower back pain with radiation into his right hip and down his leg. He also reports some intermittent right leg numbness. Pt states the pain is worse when he goes from sitting to standing.

## 2016-10-25 ENCOUNTER — Telehealth: Payer: Self-pay | Admitting: Family Medicine

## 2016-10-25 MED ORDER — NAPROXEN 500 MG PO TABS: 500.0000 mg | ORAL_TABLET | Freq: Two times a day (BID) | ORAL | 0 refills | Status: DC

## 2016-10-25 NOTE — Telephone Encounter (Signed)
Pt came in asking for a refill on the Maproxin that he was prescribed in the ED. Has unbearable pain, would like them at our pharmacy and would like someone to call him and inform him as to wether he will get the meds. Thank you.

## 2016-10-25 NOTE — Telephone Encounter (Signed)
Naproxen refilled Please inform patient

## 2016-10-25 NOTE — Telephone Encounter (Signed)
He was prescribed a 10 day supply on 10/18/16 so it has only been 7 days. He was seen in ED for sciatica. Will forward to Dr. Adrian Blackwater who is covering for Dr. Jarold Song this week.

## 2016-10-25 NOTE — Telephone Encounter (Signed)
Pt was called and informed of medication being sent over to pharmacy onsite. Pt request medication be sent to [facility] instead.

## 2016-10-27 ENCOUNTER — Ambulatory Visit: Payer: Self-pay | Attending: Family Medicine | Admitting: Physician Assistant

## 2016-10-27 VITALS — BP 116/79 | HR 100 | Temp 98.8°F | Resp 18 | Ht 73.5 in | Wt 213.0 lb

## 2016-10-27 DIAGNOSIS — E785 Hyperlipidemia, unspecified: Secondary | ICD-10-CM | POA: Insufficient documentation

## 2016-10-27 DIAGNOSIS — K219 Gastro-esophageal reflux disease without esophagitis: Secondary | ICD-10-CM | POA: Insufficient documentation

## 2016-10-27 DIAGNOSIS — M5441 Lumbago with sciatica, right side: Secondary | ICD-10-CM | POA: Insufficient documentation

## 2016-10-27 DIAGNOSIS — F17210 Nicotine dependence, cigarettes, uncomplicated: Secondary | ICD-10-CM | POA: Insufficient documentation

## 2016-10-27 DIAGNOSIS — Z79899 Other long term (current) drug therapy: Secondary | ICD-10-CM | POA: Insufficient documentation

## 2016-10-27 DIAGNOSIS — I10 Essential (primary) hypertension: Secondary | ICD-10-CM | POA: Insufficient documentation

## 2016-10-27 MED ORDER — NAPROXEN 500 MG PO TABS: 500.0000 mg | ORAL_TABLET | Freq: Two times a day (BID) | ORAL | 0 refills | Status: DC

## 2016-10-27 MED ORDER — METHOCARBAMOL 500 MG PO TABS: 500.0000 mg | ORAL_TABLET | Freq: Four times a day (QID) | ORAL | 0 refills | Status: DC

## 2016-10-27 MED FILL — METHOCARBAMOL 500 MG TABLET: 500 | 14 days supply | Qty: 56 | Fill #0

## 2016-10-27 MED FILL — NAPROXEN 500 MG TABLET: 500 | 15 days supply | Qty: 30 | Fill #0

## 2016-10-27 NOTE — Progress Notes (Signed)
Chief Complaint: "back pain"  Subjective: This is a 51 year old [place] male that is followed here for hypertension, hyperlipidemia and gastroesophageal reflux disease. He also smokes one pack per day of cigarettes. He presented to the emergency department on 10/18/2016 with low back pain for one week. He works on an Designer, television/film set at [employer]. He believes that he strained his back doing some lifting at work. Pain is located to the right side of the lower spine with radiation down the right buttock. No fecal or urinary incontinence. No urinary symptoms such as frequency, hesitancy or hematuria. He didn't try anything is far as treatment at home. When he went to the emergency department no imaging was done. He was told to do conservative measures as well as trying Robaxin and Naprosyn.  He feels better with the medication on board. He is afraid to go back to work for risk of recurrent injury.   ROS:  GEN: denies fever or chills, denies change in weight Skin: denies lesions or rashes HEENT: denies headache, earache, epistaxis, sore throat, or neck pain LUNGS: denies SHOB, dyspnea, PND, orthopnea CV: denies CP or palpitations ABD: denies abd pain, N or V EXT: + muscle spasms or swelling; no pain in lower ext, no weakness NEURO: denies numbness or tingling, denies sz, stroke or TIA  Objective:  Vitals:   10/27/16 1509  BP: 116/79  Pulse: 100  Resp: 18  Temp: 98.8 F (37.1 C)  TempSrc: Oral  SpO2: 97%  Weight: 213 lb (96.6 kg)  Height: 6' 1.5" (1.867 m)    Physical Exam:  General: in no acute distress. HEENT: no pallor, no icterus, moist oral mucosa, no JVD, no lymphadenopathy Heart: Normal  s1 &s2  Regular rate and rhythm, without murmurs, rubs, gallops. Lungs: Clear to auscultation bilaterally. Abdomen: Soft, nontender, nondistended, positive bowel sounds. Extremities: No clubbing cyanosis or edema with positive pedal pulses. Neuro: Alert, awake, oriented x3,  nonfocal.  Pertinent Lab Results:none   Medications: Prior to Admission medications   Medication Sig Start Date End Date Taking? Authorizing Provider  atorvastatin (LIPITOR) 20 MG tablet Take 1 tablet (20 mg total) by mouth daily. 05/16/16  Yes Arnoldo Morale, MD  losartan (COZAAR) 50 MG tablet Take 1 tablet (50 mg total) by mouth daily. 05/16/16  Yes Arnoldo Morale, MD  methocarbamol (ROBAXIN) 500 MG tablet Take 1 tablet (500 mg total) by mouth 4 (four) times daily. 10/18/16  Yes Carlisle Cater, PA-C  naproxen (NAPROSYN) 500 MG tablet Take 1 tablet (500 mg total) by mouth 2 (two) times daily. 10/25/16  Yes Josalyn Funches, MD  ranitidine (ZANTAC) 150 MG tablet Take 1 tablet (150 mg total) by mouth 2 (two) times daily. 05/16/16  Yes Arnoldo Morale, MD    Assessment:  Acute low back pain with right sided sciatica   Plan: No imaging at this time Refilled Robaxin and Naprosyn Back exercises Rest, moist heat etc Work note  Follow up:1 month  The patient was given clear instructions to go to ER or return to medical center if symptoms don't improve, worsen or new problems develop. The patient verbalized understanding. The patient was told to call to get lab results if they haven't heard anything in the next week.   This note has been created with Surveyor, quantity. Any transcriptional errors are unintentional.   Zettie Pho, PA-C 10/27/2016, 3:18 PM

## 2016-10-27 NOTE — Patient Instructions (Signed)

## 2016-10-27 NOTE — Progress Notes (Signed)
Patient is here for ED FU for Sciatica  Patient complains of lower back pain being present and scaled currently at a 10. Patient complains of pain radiating down the right leg.  Patient has taken medication today. Patient has eaten today.  Patient request refills on Robaxin and Naprosyn.

## 2016-10-27 NOTE — Addendum Note (Signed)
Addended by: Trecia Rogers on: 10/27/2016 03:37 PM   Modules accepted: Orders

## 2016-10-31 MED FILL — ?LOSARTAN POTASSIUM 50 MG T: 50 MG | 30 days supply | Qty: 30 | Fill #5

## 2016-11-28 ENCOUNTER — Other Ambulatory Visit: Payer: Self-pay | Admitting: Family Medicine

## 2016-11-28 DIAGNOSIS — I10 Essential (primary) hypertension: Secondary | ICD-10-CM

## 2016-11-28 MED FILL — LOSARTAN POTASSIUM 50 MG TA: 50 | 30 days supply | Qty: 30 | Fill #0

## 2016-11-30 ENCOUNTER — Ambulatory Visit: Payer: Self-pay | Attending: Family Medicine | Admitting: Family Medicine

## 2016-11-30 ENCOUNTER — Encounter: Payer: Self-pay | Admitting: Family Medicine

## 2016-11-30 VITALS — BP 145/90 | HR 105 | Temp 97.7°F | Wt 208.8 lb

## 2016-11-30 DIAGNOSIS — E785 Hyperlipidemia, unspecified: Secondary | ICD-10-CM | POA: Insufficient documentation

## 2016-11-30 DIAGNOSIS — Z720 Tobacco use: Secondary | ICD-10-CM

## 2016-11-30 DIAGNOSIS — K219 Gastro-esophageal reflux disease without esophagitis: Secondary | ICD-10-CM | POA: Insufficient documentation

## 2016-11-30 DIAGNOSIS — Z79899 Other long term (current) drug therapy: Secondary | ICD-10-CM | POA: Insufficient documentation

## 2016-11-30 DIAGNOSIS — M5431 Sciatica, right side: Secondary | ICD-10-CM | POA: Insufficient documentation

## 2016-11-30 DIAGNOSIS — M48061 Spinal stenosis, lumbar region without neurogenic claudication: Secondary | ICD-10-CM | POA: Insufficient documentation

## 2016-11-30 DIAGNOSIS — I10 Essential (primary) hypertension: Secondary | ICD-10-CM | POA: Insufficient documentation

## 2016-11-30 DIAGNOSIS — F17210 Nicotine dependence, cigarettes, uncomplicated: Secondary | ICD-10-CM | POA: Insufficient documentation

## 2016-11-30 MED ORDER — NAPROXEN 500 MG PO TABS: 500.0000 mg | ORAL_TABLET | Freq: Two times a day (BID) | ORAL | 1 refills | Status: DC

## 2016-11-30 MED ORDER — CETIRIZINE HCL 10 MG PO TABS: 10.0000 mg | ORAL_TABLET | Freq: Every day | ORAL | 1 refills | Status: DC

## 2016-11-30 MED ORDER — LOSARTAN POTASSIUM 50 MG PO TABS: 50.0000 mg | ORAL_TABLET | Freq: Every day | ORAL | 6 refills | Status: DC

## 2016-11-30 MED ORDER — RANITIDINE HCL 150 MG PO TABS: 150.0000 mg | ORAL_TABLET | Freq: Two times a day (BID) | ORAL | 6 refills | Status: DC

## 2016-11-30 MED ORDER — METHOCARBAMOL 500 MG PO TABS: 500.0000 mg | ORAL_TABLET | Freq: Three times a day (TID) | ORAL | 1 refills | Status: DC | PRN

## 2016-11-30 MED ORDER — ATORVASTATIN CALCIUM 20 MG PO TABS: 20.0000 mg | ORAL_TABLET | Freq: Every day | ORAL | 6 refills | Status: DC

## 2016-11-30 NOTE — Progress Notes (Signed)
Subjective:  Patient ID: Timothy Sanchez, male    DOB: 17-Nov-1965  Age: 51 y.o. MRN: [id number]  CC: Back Pain   HPI TONIO SEIDER  is a 51 year old male with a history of hypertension, tobacco abuse, GERD, hyperlipidemia who presents today for a follow-up visit.  He has been compliant with his antihypertensive and low-sodium diet, also taking his statin and tolerating it well. GERD symptoms are controlled on ranitidine.  He was treated for sciatica at his last visit and reports improvement in the back pain but he occasionally has shooting pain down his right leg.  He continues to smoke 2 packs of cigarettes a day and is not willing to quit.  Denies chest pains, shortness of breath or wheezing.  Past Medical History:  Diagnosis Date  . Hypertension     No past surgical history on file.  No Known Allergies   Outpatient Medications Prior to Visit  Medication Sig Dispense Refill  . atorvastatin (LIPITOR) 20 MG tablet Take 1 tablet (20 mg total) by mouth daily. 30 tablet 5  . losartan (COZAAR) 50 MG tablet TAKE 1 TABLET BY MOUTH DAILY. 30 tablet 0  . methocarbamol (ROBAXIN) 500 MG tablet Take 1 tablet (500 mg total) by mouth 4 (four) times daily. 56 tablet 0  . naproxen (NAPROSYN) 500 MG tablet Take 1 tablet (500 mg total) by mouth 2 (two) times daily. 30 tablet 0  . ranitidine (ZANTAC) 150 MG tablet Take 1 tablet (150 mg total) by mouth 2 (two) times daily. 60 tablet 5   No facility-administered medications prior to visit.     ROS Review of Systems  Constitutional: Negative for activity change and appetite change.  HENT: Negative for sinus pressure and sore throat.   Eyes: Negative for visual disturbance.  Respiratory: Negative for cough, chest tightness and shortness of breath.   Cardiovascular: Negative for chest pain and leg swelling.  Gastrointestinal: Negative for abdominal distention, abdominal pain, constipation and diarrhea.  Endocrine: Negative.     Genitourinary: Negative for dysuria.  Musculoskeletal:       See hpi  Skin: Negative for rash.  Allergic/Immunologic: Negative.   Neurological: Negative for weakness, light-headedness and numbness.  Psychiatric/Behavioral: Negative for dysphoric mood and suicidal ideas.    Objective:  BP (!) 145/90   Pulse (!) 105   Temp 97.7 F (36.5 C) (Oral)   Wt 208 lb 12.8 oz (94.7 kg)   SpO2 97%   BMI 27.17 kg/m   BP/Weight 11/30/2016 10/27/2016 11/27/3974  Systolic BP 734 193 790  Diastolic BP 90 79 89  Wt. (Lbs) 208.8 213 -  BMI 27.17 27.72 -      Physical Exam  Constitutional: He is oriented to person, place, and time. He appears well-developed and well-nourished.  Cardiovascular: Normal rate, normal heart sounds and intact distal pulses.   No murmur heard. Pulmonary/Chest: Effort normal and breath sounds normal. He has no wheezes. He has no rales. He exhibits no tenderness.  Abdominal: Soft. Bowel sounds are normal. He exhibits no distension and no mass. There is no tenderness.  Musculoskeletal: Normal range of motion. He exhibits no edema or tenderness.  Positive straight leg raise on the right  Neurological: He is alert and oriented to person, place, and time.  Skin: Skin is warm.  Psychiatric: He has a normal mood and affect.     Assessment & Plan:   1. Gastroesophageal reflux disease without esophagitis Controlled - ranitidine (ZANTAC) 150 MG tablet; Take  1 tablet (150 mg total) by mouth 2 (two) times daily.  Dispense: 60 tablet; Refill: 6  2. Essential hypertension Slightly elevated above goal of 130/80 Low-sodium diet and lifestyle modifications - losartan (COZAAR) 50 MG tablet; Take 1 tablet (50 mg total) by mouth daily.  Dispense: 30 tablet; Refill: 6 - CMP14+EGFR - Lipid panel  3. Hyperlipidemia, unspecified hyperlipidemia type Controlled Low cholesterol diet - atorvastatin (LIPITOR) 20 MG tablet; Take 1 tablet (20 mg total) by mouth daily.  Dispense: 30  tablet; Refill: 6  4. Sciatica of right side Demonstrated stretching sizes - methocarbamol (ROBAXIN) 500 MG tablet; Take 1 tablet (500 mg total) by mouth every 8 (eight) hours as needed for muscle spasms.  Dispense: 90 tablet; Refill: 1 - naproxen (NAPROSYN) 500 MG tablet; Take 1 tablet (500 mg total) by mouth 2 (two) times daily.  Dispense: 60 tablet; Refill: 1  5. Tobacco abuse Spent 3 minutes counseling on cessation and he is not ready to quit at this time.  Meds ordered this encounter  Medications  . ranitidine (ZANTAC) 150 MG tablet    Sig: Take 1 tablet (150 mg total) by mouth 2 (two) times daily.    Dispense:  60 tablet    Refill:  6  . losartan (COZAAR) 50 MG tablet    Sig: Take 1 tablet (50 mg total) by mouth daily.    Dispense:  30 tablet    Refill:  6  . atorvastatin (LIPITOR) 20 MG tablet    Sig: Take 1 tablet (20 mg total) by mouth daily.    Dispense:  30 tablet    Refill:  6  . methocarbamol (ROBAXIN) 500 MG tablet    Sig: Take 1 tablet (500 mg total) by mouth every 8 (eight) hours as needed for muscle spasms.    Dispense:  90 tablet    Refill:  1  . naproxen (NAPROSYN) 500 MG tablet    Sig: Take 1 tablet (500 mg total) by mouth 2 (two) times daily.    Dispense:  60 tablet    Refill:  1    Follow-up: Return in about 6 months (around 06/02/2017) for Chronic medical conditions.   Arnoldo Morale MD

## 2016-11-30 NOTE — Patient Instructions (Signed)
Sciatica Sciatica is pain, numbness, weakness, or tingling along the path of the sciatic nerve. The sciatic nerve starts in the lower back and runs down the back of each leg. The nerve controls the muscles in the lower leg and in the back of the knee. It also provides feeling (sensation) to the back of the thigh, the lower leg, and the sole of the foot. Sciatica is a symptom of another medical condition that pinches or puts pressure on the sciatic nerve. Generally, sciatica only affects one side of the body. Sciatica usually goes away on its own or with treatment. In some cases, sciatica may keep coming back (recur). What are the causes? This condition is caused by pressure on the sciatic nerve, or pinching of the sciatic nerve. This may be the result of:  A disk in between the bones of the spine (vertebrae) bulging out too far (herniated disk).  Age-related changes in the spinal disks (degenerative disk disease).  A pain disorder that affects a muscle in the buttock (piriformis syndrome).  Extra bone growth (bone spur) near the sciatic nerve.  An injury or break (fracture) of the pelvis.  Pregnancy.  Tumor (rare). What increases the risk? The following factors may make you more likely to develop this condition:  Playing sports that place pressure or stress on the spine, such as football or weight lifting.  Having poor strength and flexibility.  A history of back injury.  A history of back surgery.  Sitting for long periods of time.  Doing activities that involve repetitive bending or lifting.  Obesity. What are the signs or symptoms? Symptoms can vary from mild to very severe, and they may include:  Any of these problems in the lower back, leg, hip, or buttock:  Mild tingling or dull aches.  Burning sensations.  Sharp pains.  Numbness in the back of the calf or the sole of the foot.  Leg weakness.  Severe back pain that makes movement difficult. These symptoms may  get worse when you cough, sneeze, or laugh, or when you sit or stand for long periods of time. Being overweight may also make symptoms worse. In some cases, symptoms may recur over time. How is this diagnosed? This condition may be diagnosed based on:  Your symptoms.  A physical exam. Your health care provider may ask you to do certain movements to check whether those movements trigger your symptoms.  You may have tests, including:  Blood tests.  X-rays.  MRI.  CT scan. How is this treated? In many cases, this condition improves on its own, without any treatment. However, treatment may include:  Reducing or modifying physical activity during periods of pain.  Exercising and stretching to strengthen your abdomen and improve the flexibility of your spine.  Icing and applying heat to the affected area.  Medicines that help:  To relieve pain and swelling.  To relax your muscles.  Injections of medicines that help to relieve pain, irritation, and inflammation around the sciatic nerve (steroids).  Surgery. Follow these instructions at home: Medicines   Take over-the-counter and prescription medicines only as told by your health care provider.  Do not drive or operate heavy machinery while taking prescription pain medicine. Managing pain   If directed, apply ice to the affected area.  Put ice in a plastic bag.  Place a towel between your skin and the bag.  Leave the ice on for 20 minutes, 2-3 times a day.  After icing, apply heat to the   affected area before you exercise or as often as told by your health care provider. Use the heat source that your health care provider recommends, such as a moist heat pack or a heating pad.  Place a towel between your skin and the heat source.  Leave the heat on for 20-30 minutes.  Remove the heat if your skin turns bright red. This is especially important if you are unable to feel pain, heat, or cold. You may have a greater risk of  getting burned. Activity   Return to your normal activities as told by your health care provider. Ask your health care provider what activities are safe for you.  Avoid activities that make your symptoms worse.  Take brief periods of rest throughout the day. Resting in a lying or standing position is usually better than sitting to rest.  When you rest for longer periods, mix in some mild activity or stretching between periods of rest. This will help to prevent stiffness and pain.  Avoid sitting for long periods of time without moving. Get up and move around at least one time each hour.  Exercise and stretch regularly, as told by your health care provider.  Do not lift anything that is heavier than 10 lb (4.5 kg) while you have symptoms of sciatica. When you do not have symptoms, you should still avoid heavy lifting, especially repetitive heavy lifting.  When you lift objects, always use proper lifting technique, which includes:  Bending your knees.  Keeping the load close to your body.  Avoiding twisting. General instructions   Use good posture.  Avoid leaning forward while sitting.  Avoid hunching over while standing.  Maintain a healthy weight. Excess weight puts extra stress on your back and makes it difficult to maintain good posture.  Wear supportive, comfortable shoes. Avoid wearing high heels.  Avoid sleeping on a mattress that is too soft or too hard. A mattress that is firm enough to support your back when you sleep may help to reduce your pain.  Keep all follow-up visits as told by your health care provider. This is important. Contact a health care provider if:  You have pain that wakes you up when you are sleeping.  You have pain that gets worse when you lie down.  Your pain is worse than you have experienced in the past.  Your pain lasts longer than 4 weeks.  You experience unexplained weight loss. Get help right away if:  You lose control of your bowel  or bladder (incontinence).  You have:  Weakness in your lower back, pelvis, buttocks, or legs that gets worse.  Redness or swelling of your back.  A burning sensation when you urinate. This information is not intended to replace advice given to you by your health care provider. Make sure you discuss any questions you have with your health care provider. Document Released: 07/05/2001 Document Revised: 12/15/2015 Document Reviewed: 03/20/2015 Elsevier Interactive Patient Education  2017 [employer]  

## 2016-12-01 ENCOUNTER — Telehealth: Payer: Self-pay

## 2016-12-01 LAB — CMP14+EGFR
A/G RATIO: 2 (ref 1.2–2.2)
ALK PHOS: 50 IU/L (ref 39–117)
ALT: 19 IU/L (ref 0–44)
AST: 21 IU/L (ref 0–40)
Albumin: 4.1 g/dL (ref 3.5–5.5)
BILIRUBIN TOTAL: 0.4 mg/dL (ref 0.0–1.2)
BUN / CREAT RATIO: 22 — AB (ref 9–20)
BUN: 15 mg/dL (ref 6–24)
CO2: 27 mmol/L (ref 18–29)
CREATININE: 0.67 mg/dL — AB (ref 0.76–1.27)
Calcium: 9.1 mg/dL (ref 8.7–10.2)
Chloride: 101 mmol/L (ref 96–106)
GFR, EST AFRICAN AMERICAN: 130 mL/min/{1.73_m2} (ref >59)
GFR, EST NON AFRICAN AMERICAN: 113 mL/min/{1.73_m2} (ref >59)
Globulin, Total: 2.1 g/dL (ref 1.5–4.5)
Glucose: 102 mg/dL — ABNORMAL HIGH (ref 65–99)
Potassium: 4.7 mmol/L (ref 3.5–5.2)
Sodium: 140 mmol/L (ref 134–144)
TOTAL PROTEIN: 6.2 g/dL (ref 6.0–8.5)

## 2016-12-01 LAB — LIPID PANEL
CHOL/HDL RATIO: 3.1 ratio (ref 0.0–5.0)
Cholesterol, Total: 200 mg/dL — ABNORMAL HIGH (ref 100–199)
HDL: 65 mg/dL (ref >39)
LDL CALC: 123 mg/dL — AB (ref 0–99)
Triglycerides: 58 mg/dL (ref 0–149)
VLDL Cholesterol Cal: 12 mg/dL (ref 5–40)

## 2016-12-01 NOTE — Telephone Encounter (Signed)
-----   Message from Arnoldo Morale, MD sent at 12/01/2016  2:16 PM EDT ----- Cholesterol has trended up compared to set of labs. Please encourage low-cholesterol diet, exercise and weight loss.

## 2016-12-01 NOTE — Telephone Encounter (Signed)
Clld pt  - advsd of lab results. Pt stated he understood. 

## 2016-12-15 ENCOUNTER — Telehealth: Payer: Self-pay | Admitting: Family Medicine

## 2016-12-15 DIAGNOSIS — M5431 Sciatica, right side: Secondary | ICD-10-CM

## 2016-12-15 NOTE — Telephone Encounter (Signed)
Pt.l came to facility requesting to speak with his PCP regarding his back pain. Pt. Pt. Was seen this month for his back pain and would like to know if he can be referred out to a specialist. Please f/u with pt.

## 2016-12-20 NOTE — Telephone Encounter (Signed)
At his last visit, he gave the impression that his back pain was improved on current treatment regimen. Referral to a specialist is not appropriate at this time as he has not had any imaging of his back. We will need to know if pain is worsening and I will place order for imaging.

## 2016-12-20 NOTE — Telephone Encounter (Signed)
Patient verified DOB Patient is complains of back pain increasing with exertion.  Patient is now radiating down to the leg. Patient is aware of symptoms being routed to the PCP for imaging or referral approval.

## 2016-12-20 NOTE — Telephone Encounter (Signed)
Please advise if this referral is appropriate and will be placed.

## 2016-12-21 ENCOUNTER — Ambulatory Visit: Payer: Self-pay | Attending: Family Medicine

## 2016-12-23 MED ORDER — TRAMADOL HCL 50 MG PO TABS: 50.0000 mg | ORAL_TABLET | Freq: Two times a day (BID) | ORAL | 0 refills | Status: DC | PRN

## 2016-12-23 MED FILL — NAPROXEN 500 MG TABLET: 500 | 30 days supply | Qty: 60 | Fill #0

## 2016-12-23 MED FILL — LOSARTAN POTASSIUM 50 MG TA: 50 | 30 days supply | Qty: 30 | Fill #0

## 2016-12-23 MED FILL — traMADol HCL 50 MG TABS: 50 | 30 days supply | Qty: 60 | Fill #0

## 2016-12-23 MED FILL — raNITIdine HCL 150 MG TABS: 150 | 30 days supply | Qty: 60 | Fill #0

## 2016-12-23 MED FILL — ?ATORVASTATIN 20 MG TABLET: 20 | 30 days supply | Qty: 30 | Fill #0

## 2016-12-23 MED FILL — METHOCARBAMOL 500 MG TABLET: 500 | 30 days supply | Qty: 90 | Fill #0

## 2016-12-23 MED FILL — ?CETIRIZINE HCL 10 MG TABLE: 10 | 30 days supply | Qty: 30 | Fill #0

## 2016-12-23 NOTE — Telephone Encounter (Signed)
Pt aware, in office to pick up Rx and aware of imaging recommendations.

## 2016-12-23 NOTE — Telephone Encounter (Signed)
Left message on voicemail to pick up prescription at front desk also to call office. Pt needs to be notified of XR.

## 2016-12-23 NOTE — Telephone Encounter (Signed)
I have ordered a lumbar spine xray- could you please instruct him on how to obtain this? I have also written a Tramdol rx he needs to come pick up. Thanks

## 2016-12-26 ENCOUNTER — Ambulatory Visit (HOSPITAL_COMMUNITY)
Admission: RE | Admit: 2016-12-26 | Discharge: 2016-12-26 | Disposition: A | Discharge status: Home / Self Care | Payer: Self-pay | Source: Ambulatory Visit | Attending: Family Medicine | Admitting: Family Medicine

## 2016-12-26 DIAGNOSIS — M5441 Lumbago with sciatica, right side: Secondary | ICD-10-CM | POA: Insufficient documentation

## 2016-12-26 DIAGNOSIS — M4807 Spinal stenosis, lumbosacral region: Secondary | ICD-10-CM | POA: Insufficient documentation

## 2016-12-26 DIAGNOSIS — M5431 Sciatica, right side: Secondary | ICD-10-CM

## 2016-12-26 DIAGNOSIS — I708 Atherosclerosis of other arteries: Secondary | ICD-10-CM | POA: Insufficient documentation

## 2016-12-26 DIAGNOSIS — M48061 Spinal stenosis, lumbar region without neurogenic claudication: Secondary | ICD-10-CM | POA: Insufficient documentation

## 2016-12-27 ENCOUNTER — Other Ambulatory Visit: Payer: Self-pay | Admitting: Family Medicine

## 2016-12-27 DIAGNOSIS — M5431 Sciatica, right side: Secondary | ICD-10-CM

## 2017-01-05 NOTE — Telephone Encounter (Signed)
-----   Message from Arnoldo Morale, MD sent at 12/27/2016  1:17 PM EDT ----- Chest x-ray reveals narrowing of the disc space in his lumbar spine which could explain the back pain. I have ordered an MRI for him; could you please schedule this and inform the patient of his appointment? Thank you

## 2017-01-05 NOTE — Telephone Encounter (Signed)
MA informed patient of MRI being ordered due to xray showing some disc space. Patient advised of imaging being 01/16/17 which is a Monday at the [facility]. Patient is to arrive at 2:45 for a 3pm image. Medical Assistant left message on patient's home and cell voicemail. Voicemail states to give a call back to [place] with [facility] at [phone].

## 2017-01-16 ENCOUNTER — Encounter (HOSPITAL_COMMUNITY): Payer: Self-pay

## 2017-01-16 ENCOUNTER — Ambulatory Visit (HOSPITAL_COMMUNITY)
Admission: RE | Admit: 2017-01-16 | Discharge: 2017-01-16 | Disposition: A | Discharge status: Home / Self Care | Payer: Self-pay | Source: Ambulatory Visit | Attending: Family Medicine | Admitting: Family Medicine

## 2017-01-16 DIAGNOSIS — M5431 Sciatica, right side: Secondary | ICD-10-CM

## 2017-01-23 MED FILL — ?ATORVASTATIN 20 MG TABLET: 20 | 30 days supply | Qty: 30 | Fill #1

## 2017-01-23 MED FILL — LOSARTAN POTASSIUM 50 MG TA: 50 | 30 days supply | Qty: 30 | Fill #1

## 2017-01-26 ENCOUNTER — Telehealth: Payer: Self-pay | Admitting: Family Medicine

## 2017-01-26 NOTE — Telephone Encounter (Signed)
PT came to the office inquiring about his OC application, please call him back

## 2017-02-06 ENCOUNTER — Ambulatory Visit (HOSPITAL_COMMUNITY)
Admission: RE | Admit: 2017-02-06 | Discharge: 2017-02-06 | Disposition: A | Discharge status: Home / Self Care | Payer: Self-pay | Source: Ambulatory Visit | Attending: Family Medicine | Admitting: Family Medicine

## 2017-02-06 DIAGNOSIS — M5126 Other intervertebral disc displacement, lumbar region: Secondary | ICD-10-CM | POA: Insufficient documentation

## 2017-02-06 DIAGNOSIS — M4807 Spinal stenosis, lumbosacral region: Secondary | ICD-10-CM | POA: Insufficient documentation

## 2017-02-06 DIAGNOSIS — M5127 Other intervertebral disc displacement, lumbosacral region: Secondary | ICD-10-CM | POA: Insufficient documentation

## 2017-02-08 ENCOUNTER — Other Ambulatory Visit: Payer: Self-pay | Admitting: Family Medicine

## 2017-02-08 DIAGNOSIS — M48061 Spinal stenosis, lumbar region without neurogenic claudication: Secondary | ICD-10-CM

## 2017-02-13 MED FILL — ?OMEPRAZOLE DR 20 MG CAPSUL: 20 | 30 days supply | Qty: 60 | Fill #0

## 2017-02-20 MED FILL — LOSARTAN POTASSIUM 50 MG TA: 50 | 30 days supply | Qty: 30 | Fill #2

## 2017-02-21 ENCOUNTER — Ambulatory Visit: Payer: Self-pay | Admitting: Family Medicine

## 2017-03-21 MED FILL — LOSARTAN POTASSIUM 50 MG TA: 50 | 30 days supply | Qty: 30 | Fill #3

## 2017-03-24 ENCOUNTER — Other Ambulatory Visit: Payer: Self-pay | Admitting: Family Medicine

## 2017-03-24 MED FILL — ?ATORVASTATIN 20 MG TABLET: 20 | 30 days supply | Qty: 30 | Fill #2

## 2017-04-03 ENCOUNTER — Other Ambulatory Visit: Payer: Self-pay | Admitting: Family Medicine

## 2017-04-04 ENCOUNTER — Other Ambulatory Visit: Payer: Self-pay | Admitting: Family Medicine

## 2017-04-10 ENCOUNTER — Ambulatory Visit: Payer: Self-pay | Attending: Family Medicine | Admitting: Family Medicine

## 2017-04-10 ENCOUNTER — Encounter: Payer: Self-pay | Admitting: Family Medicine

## 2017-04-10 VITALS — BP 113/73 | HR 106 | Temp 98.1°F | Ht 73.5 in | Wt 212.0 lb

## 2017-04-10 DIAGNOSIS — E785 Hyperlipidemia, unspecified: Secondary | ICD-10-CM | POA: Insufficient documentation

## 2017-04-10 DIAGNOSIS — M48062 Spinal stenosis, lumbar region with neurogenic claudication: Secondary | ICD-10-CM | POA: Insufficient documentation

## 2017-04-10 DIAGNOSIS — K219 Gastro-esophageal reflux disease without esophagitis: Secondary | ICD-10-CM | POA: Insufficient documentation

## 2017-04-10 DIAGNOSIS — I10 Essential (primary) hypertension: Secondary | ICD-10-CM | POA: Insufficient documentation

## 2017-04-10 DIAGNOSIS — M5126 Other intervertebral disc displacement, lumbar region: Secondary | ICD-10-CM | POA: Insufficient documentation

## 2017-04-10 DIAGNOSIS — Z79899 Other long term (current) drug therapy: Secondary | ICD-10-CM | POA: Insufficient documentation

## 2017-04-10 DIAGNOSIS — Z79891 Long term (current) use of opiate analgesic: Secondary | ICD-10-CM | POA: Insufficient documentation

## 2017-04-10 DIAGNOSIS — R531 Weakness: Secondary | ICD-10-CM | POA: Insufficient documentation

## 2017-04-10 MED ORDER — LOSARTAN POTASSIUM 50 MG PO TABS: 50.0000 mg | ORAL_TABLET | Freq: Every day | ORAL | 6 refills | Status: DC

## 2017-04-10 MED ORDER — RANITIDINE HCL 150 MG PO TABS: 150.0000 mg | ORAL_TABLET | Freq: Two times a day (BID) | ORAL | 6 refills | Status: DC

## 2017-04-10 MED ORDER — ATORVASTATIN CALCIUM 20 MG PO TABS: 20.0000 mg | ORAL_TABLET | Freq: Every day | ORAL | 6 refills | Status: DC

## 2017-04-10 MED FILL — raNITIdine HCL 150 MG TABS: 150 | 30 days supply | Qty: 60 | Fill #1

## 2017-04-10 NOTE — Patient Instructions (Signed)

## 2017-04-10 NOTE — Progress Notes (Signed)
Subjective:  Patient ID: Timothy Sanchez, male    DOB: 1966/02/17  Age: 51 y.o. MRN: [id number]  CC: Referral (ortho)   HPI Timothy Sanchez is a 51 year old male with a history of hypertension, tobacco abuse, GERD, hyperlipidemia who presents today for a follow-up visit.  He continues to complain of low back pain which radiates to his right lower extremity with associated weakness; he complains of a 'shock like sensation' in his right leg which sometimes gives out. He discontinued tramadol, naproxen, Robaxin due to ineffectiveness and would just like a referral to a specialist. He denies loss of sphincteric function or recent falls. 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  He has been compliant with his antihypertensive, tolerates his statin with no complaint of myopathy. He ran out of his Ranitidine but states his reflux symptoms were controlled while he took it.  Past Medical History:  Diagnosis Date  . Hypertension     No past surgical history on file.  No Known Allergies   Outpatient Medications Prior to Visit  Medication Sig Dispense Refill  . atorvastatin (LIPITOR) 20 MG tablet Take 1 tablet (20 mg total) by mouth daily. 30 tablet 6  . losartan (COZAAR) 50 MG tablet Take 1 tablet (50 mg total) by mouth daily. 30 tablet 6  . ranitidine (ZANTAC) 150 MG tablet Take 1 tablet (150 mg total) by mouth 2 (two) times daily. 60 tablet 6  . cetirizine (ZYRTEC) 10 MG tablet Take 1 tablet (10 mg total) by mouth daily. (Patient not taking: Reported on 04/10/2017) 30 tablet 1  . methocarbamol (ROBAXIN) 500 MG tablet Take 1 tablet (500 mg total) by mouth every 8 (eight) hours as needed for muscle spasms. (Patient not taking: Reported on 04/10/2017) 90 tablet 1  . naproxen (NAPROSYN) 500 MG tablet Take 1 tablet (500 mg total) by mouth 2  (two) times daily. (Patient not taking: Reported on 04/10/2017) 60 tablet 1  . traMADol (ULTRAM) 50 MG tablet Take 1 tablet (50 mg total) by mouth every 12 (twelve) hours as needed. (Patient not taking: Reported on 04/10/2017) 60 tablet 0   No facility-administered medications prior to visit.     ROS Review of Systems  Constitutional: Negative for activity change and appetite change.  HENT: Negative for sinus pressure and sore throat.   Eyes: Negative for visual disturbance.  Respiratory: Negative for cough, chest tightness and shortness of breath.   Cardiovascular: Negative for chest pain and leg swelling.  Gastrointestinal: Negative for abdominal distention, abdominal pain, constipation and diarrhea.  Endocrine: Negative.   Genitourinary: Negative for dysuria.  Musculoskeletal: Positive for back pain. Negative for joint swelling and myalgias.  Skin: Negative for rash.  Allergic/Immunologic: Negative.   Neurological: Positive for numbness. Negative for weakness and light-headedness.  Psychiatric/Behavioral: Negative for dysphoric mood and suicidal ideas.    Objective:  BP 113/73   Pulse (!) 106   Temp 98.1 F (36.7 C) (Oral)   Ht 6' 1.5" (1.867 m)   Wt 212 lb (96.2 kg)   SpO2 98%   BMI 27.59 kg/m   BP/Weight 04/10/2017 01/26/9162 02/25/6658  Systolic BP 935 701 779  Diastolic BP 73 90 79  Wt. (Lbs) 212 208.8 213  BMI 27.59 27.17 27.72      Physical Exam  Constitutional: He is oriented to person, place, and time. He appears well-developed and  well-nourished.  Cardiovascular: Normal rate, normal heart sounds and intact distal pulses.   No murmur heard. Pulmonary/Chest: Effort normal and breath sounds normal. He has no wheezes. He has no rales. He exhibits no tenderness.  Abdominal: Soft. Bowel sounds are normal. He exhibits no distension and no mass. There is no tenderness.  Musculoskeletal: Normal range of motion. He exhibits tenderness (tenderness on palpation of lumbar  spine; negative straight leg raise bilaterally).  Neurological: He is alert and oriented to person, place, and time.  Skin: Skin is warm.  Psychiatric: He has a normal mood and affect.       CMP Latest Ref Rng & Units 11/30/2016 05/18/2016 10/22/2014  Glucose 65 - 99 mg/dL 102(H) 98 104(H)  BUN 6 - 24 mg/dL 15 13 14   Creatinine 0.76 - 1.27 mg/dL 0.67(L) 0.87 0.75  Sodium 134 - 144 mmol/L 140 136 135  Potassium 3.5 - 5.2 mmol/L 4.7 4.3 4.5  Chloride 96 - 106 mmol/L 101 102 100  CO2 18 - 29 mmol/L 27 27 27   Calcium 8.7 - 10.2 mg/dL 9.1 8.8 9.9  Total Protein 6.0 - 8.5 g/dL 6.2 6.1 6.6  Total Bilirubin 0.0 - 1.2 mg/dL 0.4 0.4 0.3  Alkaline Phos 39 - 117 IU/L 50 54 55  AST 0 - 40 IU/L 21 26 17   ALT 0 - 44 IU/L 19 21 21     Lipid Panel     Component Value Date/Time   CHOL 200 (H) 11/30/2016 1603   TRIG 58 11/30/2016 1603   HDL 65 11/30/2016 1603   CHOLHDL 3.1 11/30/2016 1603   CHOLHDL 2.0 05/18/2016 1511   VLDL 16 05/18/2016 1511   LDLCALC 123 (H) 11/30/2016 1603    CLINICAL DATA:  Severe low back pain. RIGHT leg radicular symptoms. Symptom duration unspecified.  EXAM: MRI LUMBAR SPINE WITHOUT CONTRAST  TECHNIQUE: Multiplanar, multisequence MR imaging of the lumbar spine was performed. No intravenous contrast was administered.  COMPARISON:  None.  FINDINGS: Segmentation:  Standard  Alignment:  Anatomic  Vertebrae: No worrisome osseous lesion. Non worrisome appearing L1 hemangioma.  Conus medullaris: Extends to the L1 level and appears normal.  Paraspinal and other soft tissues: Unremarkable.  Disc levels:  L1-L2:  Normal.  L2-L3:  Normal.  L3-L4: Annular bulge. Mild facet arthropathy. Short pedicles. No definite impingement.  L4-L5: Central protrusion. Osseous spurring. Short pedicles. Facet arthropathy and ligamentum flavum hypertrophy. Mild to moderate stenosis. BILATERAL L5 nerve root impingement.  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 Short pedicles. Posterior element hypertrophy. Moderate to severe stenosis. RIGHT greater than LEFT S1 nerve root impingement. No significant foraminal narrowing.  IMPRESSION: The dominant RIGHT-sided abnormality is at L5-S1. 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 Short pedicles with posterior element hypertrophy contribute to moderate to severe stenosis. RIGHT greater than LEFT S1 nerve root impingement.  Similar findings at L4-5 with a central protrusion, osseous spurring, short pedicles and posterior element hypertrophy. BILATERAL L5 nerve root impingement is noted.   Electronically Signed   By: Staci Righter M.D.   On: 02/06/2017 15:24  Assessment & Plan:   1. Gastroesophageal reflux disease without esophagitis Controlled - ranitidine (ZANTAC) 150 MG tablet; Take 1 tablet (150 mg total) by mouth 2 (two) times daily.  Dispense: 60 tablet; Refill: 6  2. Hyperlipidemia, unspecified hyperlipidemia type Controlled Low-cholesterol diet - atorvastatin (LIPITOR) 20 MG tablet; Take 1 tablet (20 mg total) by mouth daily.  Dispense: 30 tablet; Refill: 6  3. Essential hypertension  Controlled Low-sodium diet - losartan (COZAAR) 50 MG tablet; Take 1 tablet (50 mg total) by mouth daily.  Dispense: 30 tablet; Refill: 6  4. Spinal stenosis of lumbar region with neurogenic claudication He declined refills of naproxen, ramadol for muscle relaxant due to ineffectiveness and would rather wait for his referral to come through. - Ambulatory referral to Spine Surgery   Meds ordered this encounter  Medications  . ranitidine (ZANTAC) 150 MG tablet    Sig: Take 1 tablet (150 mg total) by mouth 2 (two) times daily.    Dispense:  60 tablet    Refill:  6  . atorvastatin (LIPITOR) 20 MG tablet    Sig: Take 1 tablet (20 mg total) by mouth daily.    Dispense:  30 tablet    Refill:  6  . losartan (COZAAR) 50 MG tablet    Sig: Take 1 tablet  (50 mg total) by mouth daily.    Dispense:  30 tablet    Refill:  6    Follow-up: Return in about 6 months (around 10/08/2017) for Hypertension and hyperlipidemia.   Arnoldo Morale MD

## 2017-04-19 MED FILL — LOSARTAN POTASSIUM 50 MG TA: 50 | 30 days supply | Qty: 30 | Fill #4

## 2017-05-15 MED FILL — LOSARTAN POTASSIUM 50 MG TA: 50 | 30 days supply | Qty: 30 | Fill #5

## 2017-05-29 MED FILL — raNITIdine HCL 150 MG TABS: 150 | 30 days supply | Qty: 60 | Fill #2

## 2017-05-29 MED FILL — ?ATORVASTATIN 20 MG TABLET: 20 | 30 days supply | Qty: 30 | Fill #0

## 2017-06-12 MED FILL — LOSARTAN POTASSIUM 50 MG TA: 50 | 30 days supply | Qty: 30 | Fill #6

## 2017-07-05 MED FILL — LOSARTAN POTASSIUM 50 MG TA: 50 | 30 days supply | Qty: 30 | Fill #0

## 2017-07-05 MED FILL — raNITIdine HCL 150 MG TABS: 150 | 30 days supply | Qty: 60 | Fill #3

## 2017-07-07 MED FILL — ?ATORVASTATIN 20 MG TABLET: 20 | 30 days supply | Qty: 30 | Fill #1

## 2017-07-31 MED FILL — raNITIdine HCL 150 MG TABS: 150 | 30 days supply | Qty: 60 | Fill #4

## 2017-07-31 MED FILL — LOSARTAN POTASSIUM 50 MG TA: 50 | 30 days supply | Qty: 30 | Fill #1

## 2017-08-02 MED FILL — ?ATORVASTATIN 20MG TABLET: 20 | 30 days supply | Qty: 30 | Fill #2

## 2017-08-30 ENCOUNTER — Ambulatory Visit: Payer: Self-pay | Attending: Family Medicine | Admitting: Family Medicine

## 2017-08-30 ENCOUNTER — Encounter: Payer: Self-pay | Admitting: Family Medicine

## 2017-08-30 VITALS — BP 128/76 | HR 101 | Temp 97.8°F | Ht 73.0 in | Wt 215.0 lb

## 2017-08-30 DIAGNOSIS — Z79899 Other long term (current) drug therapy: Secondary | ICD-10-CM | POA: Insufficient documentation

## 2017-08-30 DIAGNOSIS — Z87891 Personal history of nicotine dependence: Secondary | ICD-10-CM | POA: Insufficient documentation

## 2017-08-30 DIAGNOSIS — K219 Gastro-esophageal reflux disease without esophagitis: Secondary | ICD-10-CM | POA: Insufficient documentation

## 2017-08-30 DIAGNOSIS — M48062 Spinal stenosis, lumbar region with neurogenic claudication: Secondary | ICD-10-CM | POA: Insufficient documentation

## 2017-08-30 DIAGNOSIS — E785 Hyperlipidemia, unspecified: Secondary | ICD-10-CM | POA: Insufficient documentation

## 2017-08-30 DIAGNOSIS — I10 Essential (primary) hypertension: Secondary | ICD-10-CM | POA: Insufficient documentation

## 2017-08-30 MED ORDER — ATORVASTATIN CALCIUM 20 MG PO TABS: 20.0000 mg | ORAL_TABLET | Freq: Every day | ORAL | 6 refills | Status: DC

## 2017-08-30 MED ORDER — GABAPENTIN 300 MG PO CAPS: 300.0000 mg | ORAL_CAPSULE | Freq: Every day | ORAL | 3 refills | Status: DC

## 2017-08-30 MED ORDER — LOSARTAN POTASSIUM 50 MG PO TABS: 50.0000 mg | ORAL_TABLET | Freq: Every day | ORAL | 6 refills | Status: DC

## 2017-08-30 NOTE — Progress Notes (Signed)
Subjective:  Patient ID: Timothy Sanchez, male    DOB: [birth year]  Age: 52 y.o. 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  His back pain has been intermittent and was previously controlled on his medications but 1 week ago he noticed an exacerbation of the pain. It radiates to his right lower extremity with associated weakness; he complains of a 'shock like sensation' in his right leg which sometimes gives out and prevents him from sleeping At this time pain is rated at a 7-8/10. I had referred him to neurosurgery in 03/2017 however he is yet to hear from them.  He denies loss of sphincteric function or recent falls. 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  He discontinued his atorvastatin as he was of the opinion that he worsened his reflux symptoms and despite taking famotidine his symptoms have been uncontrolled.  Past Medical History:  Diagnosis Date  . Hypertension     History reviewed. No pertinent surgical history.  No Known Allergies   Outpatient Medications Prior to Visit  Medication Sig Dispense Refill  . losartan (COZAAR) 50 MG tablet Take 1 tablet (50 mg total) by mouth daily. 30 tablet 6  . cetirizine (ZYRTEC) 10 MG tablet Take 1 tablet (10 mg total) by mouth daily. (Patient not taking: Reported on 04/10/2017) 30 tablet 1  . methocarbamol (ROBAXIN) 500 MG tablet Take 1 tablet (500 mg total) by mouth every 8 (eight) hours as needed for muscle spasms. (Patient not taking: Reported on 04/10/2017) 90 tablet 1  . naproxen (NAPROSYN) 500 MG tablet Take 1 tablet (500 mg total) by mouth 2 (two) times daily. (Patient not  taking: Reported on 04/10/2017) 60 tablet 1  . traMADol (ULTRAM) 50 MG tablet Take 1 tablet (50 mg total) by mouth every 12 (twelve) hours as needed. (Patient not taking: Reported on 04/10/2017) 60 tablet 0  . atorvastatin (LIPITOR) 20 MG tablet Take 1 tablet (20 mg total) by mouth daily. (Patient not taking: Reported on 08/30/2017) 30 tablet 6  . ranitidine (ZANTAC) 150 MG tablet Take 1 tablet (150 mg total) by mouth 2 (two) times daily. (Patient not taking: Reported on 08/30/2017) 60 tablet 6   No facility-administered medications prior to visit.     ROS Review of Systems  Constitutional: Negative for activity change and appetite change.  HENT: Negative for sinus pressure and sore throat.   Eyes: Negative for visual disturbance.  Respiratory: Negative for cough, chest tightness and shortness of breath.   Cardiovascular: Negative for chest pain and leg swelling.  Gastrointestinal: Negative for abdominal distention, abdominal pain, constipation and diarrhea.  Endocrine: Negative.   Genitourinary: Negative for dysuria.  Musculoskeletal: Positive for back pain. Negative for joint swelling and myalgias.  Skin: Negative for rash.  Allergic/Immunologic: Negative.   Neurological: Negative for weakness, light-headedness and numbness.  Psychiatric/Behavioral: Negative for dysphoric mood and suicidal ideas.    Objective:  BP 128/76   Pulse (!) 101   Temp 97.8 F (36.6 C) (Oral)   Ht 6\' 1"  (1.854 m)   Wt 215 lb (97.5 kg)   SpO2 95%   BMI 28.37 kg/m   BP/Weight 08/30/2017 04/10/2017 11/30/2016  Systolic BP 341 962 229  Diastolic BP 76 73 90  Wt. (Lbs) 215 212 208.8  BMI 28.37 27.59 27.17      Physical Exam  Constitutional: He is oriented to person, place, and time. He appears well-developed and well-nourished.  Cardiovascular: Normal rate, normal heart sounds and intact distal pulses.  No murmur heard. Pulmonary/Chest: Effort normal and breath sounds normal. He has no wheezes. He has no  rales. He exhibits no tenderness.  Abdominal: Soft. Bowel sounds are normal. He exhibits no distension and no mass. There is no tenderness.  Musculoskeletal: Normal range of motion. He exhibits no tenderness (No TTP of lumbar spine; positive straight leg raise on the right).  Neurological: He is alert and oriented to person, place, and time.  Psychiatric: He has a normal mood and affect.     CMP Latest Ref Rng & Units 11/30/2016 05/18/2016 10/22/2014  Glucose 65 - 99 mg/dL 102(H) 98 104(H)  BUN 6 - 24 mg/dL 15 13 14   Creatinine 0.76 - 1.27 mg/dL 0.67(L) 0.87 0.75  Sodium 134 - 144 mmol/L 140 136 135  Potassium 3.5 - 5.2 mmol/L 4.7 4.3 4.5  Chloride 96 - 106 mmol/L 101 102 100  CO2 18 - 29 mmol/L 27 27 27   Calcium 8.7 - 10.2 mg/dL 9.1 8.8 9.9  Total Protein 6.0 - 8.5 g/dL 6.2 6.1 6.6  Total Bilirubin 0.0 - 1.2 mg/dL 0.4 0.4 0.3  Alkaline Phos 39 - 117 IU/L 50 54 55  AST 0 - 40 IU/L 21 26 17   ALT 0 - 44 IU/L 19 21 21     Lipid Panel     Component Value Date/Time   CHOL 200 (H) 11/30/2016 1603   TRIG 58 11/30/2016 1603   HDL 65 11/30/2016 1603   CHOLHDL 3.1 11/30/2016 1603   CHOLHDL 2.0 05/18/2016 1511   VLDL 16 05/18/2016 1511   LDLCALC 123 (H) 11/30/2016 1603    The 10-year ASCVD risk score Mikey Bussing DC Jr., et al., 2013) is: 6.8%   Values used to calculate the score:     Age: 79 years     Sex: Male     Is Non-Hispanic African American: No     Diabetic: No     Tobacco smoker: Yes     Systolic Blood Pressure: 798 mmHg     Is BP treated: Yes     HDL Cholesterol: 65 mg/dL     Total Cholesterol: 200 mg/dL   Assessment & Plan:   1. Hyperlipidemia, unspecified hyperlipidemia type Uncontrolled He is of the opinion that his statin is responsible for worsening of his reflux symptoms I have discussed his increased cardiovascular risk with him given his history of smoking and he is agreeable to continue with his statin while we commence workup for possible Helicobacter pylori  gastritis Continue low-cholesterol diet - atorvastatin (LIPITOR) 20 MG tablet; Take 1 tablet (20 mg total) by mouth daily.  Dispense: 30 tablet; Refill: 6  2. Essential hypertension Controlled Counseled on blood pressure goal of less than 130/80, low-sodium, DASH diet, medication compliance, 150 minutes of moderate intensity exercise per week. Discussed medication compliance, adverse effects. - losartan (COZAAR) 50 MG tablet; Take 1 tablet (50 mg total) by mouth daily.  Dispense: 30 tablet; Refill: 6  3. Spinal stenosis of lumbar region with neurogenic claudication Gabapentin added to regimen, continue Robaxin - Ambulatory referral to Spine Surgery  4. Gastroesophageal reflux disease without esophagitis Uncontrolled on famotidine We will switch to PPI if H. pylori  is negative and symptoms persist. - H. pylori breath test   Meds ordered this encounter  Medications  . atorvastatin (LIPITOR) 20 MG tablet    Sig: Take 1 tablet (20 mg total) by mouth daily.    Dispense:  30 tablet    Refill:  6  . losartan (COZAAR) 50 MG tablet    Sig: Take 1 tablet (50 mg total) by mouth daily.    Dispense:  30 tablet    Refill:  6  . gabapentin (NEURONTIN) 300 MG capsule    Sig: Take 1 capsule (300 mg total) by mouth at bedtime.    Dispense:  60 capsule    Refill:  3    Follow-up: Return in about 3 months (around 11/27/2017) for Follow-up of chronic medical conditions.   Charlott Rakes MD

## 2017-08-30 NOTE — Patient Instructions (Signed)

## 2017-09-01 ENCOUNTER — Other Ambulatory Visit: Payer: Self-pay | Admitting: Family Medicine

## 2017-09-01 LAB — H. PYLORI BREATH TEST: H PYLORI BREATH TEST: NEGATIVE

## 2017-09-01 MED ORDER — OMEPRAZOLE 40 MG PO CPDR: 40.0000 mg | DELAYED_RELEASE_CAPSULE | Freq: Every day | ORAL | 3 refills | Status: DC

## 2017-09-01 MED FILL — OMEPRAZOLE DR 40 MG CAPSULE: 40 | 30 days supply | Qty: 30 | Fill #0

## 2017-09-07 ENCOUNTER — Telehealth: Payer: Self-pay

## 2017-09-07 MED FILL — GABAPENTIN 300 MG CAPSULE: 300 | 30 days supply | Qty: 30 | Fill #0

## 2017-09-07 MED FILL — LOSARTAN POTASSIUM 50 MG TA: 50 | 30 days supply | Qty: 30 | Fill #0

## 2017-09-07 MED FILL — ?ATORVASTATIN 20MG TABL: 20 | 30 days supply | Qty: 30 | Fill #0

## 2017-09-07 NOTE — Telephone Encounter (Signed)
Patient was called and informed of lab results and medication being sent to pharmacy. 

## 2017-10-09 MED FILL — LOSARTAN POTASSIUM 50 MG TA: 50 | 30 days supply | Qty: 30 | Fill #1

## 2017-11-09 MED FILL — LOSARTAN POTASSIUM 50 MG TA: 50 | 30 days supply | Qty: 30 | Fill #2

## 2017-11-27 ENCOUNTER — Ambulatory Visit: Payer: Self-pay | Admitting: Family Medicine

## 2017-12-06 MED FILL — LOSARTAN POTASSIUM 50 MG TA: 50 | 30 days supply | Qty: 30 | Fill #3

## 2017-12-08 MED FILL — OMEPRAZOLE DR 40 MG CAPSULE: 40 | 30 days supply | Qty: 30 | Fill #1

## 2017-12-08 MED FILL — ATORVASTATIN 20 MG TABLET: 20 | 30 days supply | Qty: 30 | Fill #1

## 2018-01-02 MED FILL — LOSARTAN POTASSIUM 50 MG TA: 50 | 30 days supply | Qty: 30 | Fill #4

## 2018-01-07 ENCOUNTER — Encounter

## 2018-01-08 ENCOUNTER — Encounter: Payer: Self-pay | Admitting: Family Medicine

## 2018-01-08 ENCOUNTER — Ambulatory Visit: Payer: Self-pay | Attending: Family Medicine | Admitting: Family Medicine

## 2018-01-08 VITALS — BP 117/78 | HR 106 | Temp 98.1°F | Ht 73.0 in | Wt 219.6 lb

## 2018-01-08 DIAGNOSIS — E785 Hyperlipidemia, unspecified: Secondary | ICD-10-CM

## 2018-01-08 DIAGNOSIS — M48062 Spinal stenosis, lumbar region with neurogenic claudication: Secondary | ICD-10-CM

## 2018-01-08 DIAGNOSIS — F17210 Nicotine dependence, cigarettes, uncomplicated: Secondary | ICD-10-CM | POA: Insufficient documentation

## 2018-01-08 DIAGNOSIS — K219 Gastro-esophageal reflux disease without esophagitis: Secondary | ICD-10-CM

## 2018-01-08 DIAGNOSIS — G4739 Other sleep apnea: Secondary | ICD-10-CM

## 2018-01-08 DIAGNOSIS — R0683 Snoring: Secondary | ICD-10-CM | POA: Insufficient documentation

## 2018-01-08 DIAGNOSIS — I10 Essential (primary) hypertension: Secondary | ICD-10-CM

## 2018-01-08 DIAGNOSIS — Z79899 Other long term (current) drug therapy: Secondary | ICD-10-CM | POA: Insufficient documentation

## 2018-01-08 DIAGNOSIS — Z720 Tobacco use: Secondary | ICD-10-CM

## 2018-01-08 MED ORDER — OMEPRAZOLE 40 MG PO CPDR: 40.0000 mg | DELAYED_RELEASE_CAPSULE | Freq: Every day | ORAL | 6 refills | Status: DC

## 2018-01-08 MED ORDER — LOSARTAN POTASSIUM 50 MG PO TABS: 50.0000 mg | ORAL_TABLET | Freq: Every day | ORAL | 6 refills | Status: DC

## 2018-01-08 MED ORDER — ATORVASTATIN CALCIUM 20 MG PO TABS: 20.0000 mg | ORAL_TABLET | Freq: Every day | ORAL | 6 refills | Status: DC

## 2018-01-08 NOTE — Patient Instructions (Signed)
Steps to Quit Smoking Smoking tobacco can be bad for your health. It can also affect almost every organ in your body. Smoking puts you and people around you at risk for many serious long-lasting (chronic) diseases. Quitting smoking is hard, but it is one of the best things that you can do for your health. It is never too late to quit. What are the benefits of quitting smoking? When you quit smoking, you lower your risk for getting serious diseases and conditions. They can include:  Lung cancer or lung disease.  Heart disease.  Stroke.  Heart attack.  Not being able to have children (infertility).  Weak bones (osteoporosis) and broken bones (fractures).  If you have coughing, wheezing, and shortness of breath, those symptoms may get better when you quit. You may also get sick less often. If you are pregnant, quitting smoking can help to lower your chances of having a baby of low birth weight. What can I do to help me quit smoking? Talk with your doctor about what can help you quit smoking. Some things you can do (strategies) include:  Quitting smoking totally, instead of slowly cutting back how much you smoke over a period of time.  Going to in-person counseling. You are more likely to quit if you go to many counseling sessions.  Using resources and support systems, such as: ? Online chats with a counselor. ? Phone quitlines. ? Printed self-help materials. ? Support groups or group counseling. ? Text messaging programs. ? Mobile phone apps or applications.  Taking medicines. Some of these medicines may have nicotine in them. If you are pregnant or breastfeeding, do not take any medicines to quit smoking unless your doctor says it is okay. Talk with your doctor about counseling or other things that can help you.  Talk with your doctor about using more than one strategy at the same time, such as taking medicines while you are also going to in-person counseling. This can help make  quitting easier. What things can I do to make it easier to quit? Quitting smoking might feel very hard at first, but there is a lot that you can do to make it easier. Take these steps:  Talk to your family and friends. Ask them to support and encourage you.  Call phone quitlines, reach out to support groups, or work with a counselor.  Ask people who smoke to not smoke around you.  Avoid places that make you want (trigger) to smoke, such as: ? Bars. ? Parties. ? Smoke-break areas at work.  Spend time with people who do not smoke.  Lower the stress in your life. Stress can make you want to smoke. Try these things to help your stress: ? Getting regular exercise. ? Deep-breathing exercises. ? Yoga. ? Meditating. ? Doing a body scan. To do this, close your eyes, focus on one area of your body at a time from head to toe, and notice which parts of your body are tense. Try to relax the muscles in those areas.  Download or buy apps on your mobile phone or tablet that can help you stick to your quit plan. There are many free apps, such as QuitGuide from the CDC (Centers for Disease Control and Prevention). You can find more support from smokefree.gov and other websites.  This information is not intended to replace advice given to you by your health care provider. Make sure you discuss any questions you have with your health care provider. Document Released: 05/07/2009 Document   Revised: 03/08/2016 Document Reviewed: 11/25/2014 Elsevier Interactive Patient Education  2018 [employer] Inc[employer]  

## 2018-01-08 NOTE — Progress Notes (Signed)
Subjective:  Patient ID: Timothy Sanchez, male    DOB: [birth year]  Age: 51 y.o. 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 He was referred to [place] [facility] whom he has been unable to see as they required $200 copayment.  Lumbar back pain is controlled at this time and he has not needed his medications lately but states he will use it as needed. Doing well on his statin and his antihypertensive and denies adverse effects.  Reflux symptoms are controlled and he denies abdominal pain, nausea or vomiting. He continues to smoke cigarettes and is not ready to quit at this time. He has been snoring at night and waking up to catch his breath as per his wife and he endorses daytime fatigue and headaches.  He has never been evaluated for sleep apnea. ] Past Medical History:  Diagnosis Date  . Hypertension     History reviewed. No pertinent surgical history.  No Known Allergies   Outpatient Medications Prior to Visit  Medication Sig Dispense Refill  . gabapentin (NEURONTIN) 300 MG capsule Take 1 capsule (300 mg total) by mouth at bedtime. 60 capsule 3  . methocarbamol (ROBAXIN) 500 MG tablet Take 1 tablet (500 mg total) by mouth every 8 (eight) hours as needed for muscle spasms. 90 tablet 1  . atorvastatin (LIPITOR) 20 MG tablet Take 1 tablet (20 mg total) by mouth daily. 30 tablet 6  . losartan (COZAAR) 50 MG tablet Take 1 tablet (50 mg total) by mouth daily. 30 tablet 6  . omeprazole (PRILOSEC) 40 MG capsule Take 1 capsule (40 mg total) by mouth daily. 30 capsule 3  . cetirizine (ZYRTEC) 10 MG tablet Take 1 tablet (10 mg total) by mouth daily. (Patient not taking: Reported on 04/10/2017) 30 tablet 1  . naproxen (NAPROSYN) 500 MG tablet Take 1 tablet (500 mg total) by mouth 2 (two) times  daily. (Patient not taking: Reported on 04/10/2017) 60 tablet 1  . traMADol (ULTRAM) 50 MG tablet Take 1 tablet (50 mg total) by mouth every 12 (twelve) hours as needed. (Patient not taking: Reported on 04/10/2017) 60 tablet 0   No facility-administered medications prior to visit.     ROS Review of Systems  Constitutional: Negative for activity change and appetite change.  HENT: Negative for sinus pressure and sore throat.   Eyes: Negative for visual disturbance.  Respiratory: Negative for cough, chest tightness and shortness of breath.   Cardiovascular: Negative for chest pain and leg swelling.  Gastrointestinal: Negative for abdominal distention, abdominal pain, constipation and diarrhea.  Endocrine: Negative.   Genitourinary: Negative for dysuria.  Musculoskeletal: Negative for joint swelling and myalgias.  Skin: Negative for rash.  Allergic/Immunologic: Negative.   Neurological: Negative for weakness, light-headedness and numbness.  Psychiatric/Behavioral: Negative for dysphoric mood and suicidal ideas.    Objective:  BP 117/78   Pulse (!) 106   Temp 98.1 F (36.7 C) (Oral)   Ht _0  (1.854 m)   Wt 219 lb 9.6 oz (99.6 kg)   SpO2 97%   BMI 28.97 kg/m   BP/Weight 01/08/2018 08/30/2017 9/56/3875  Systolic BP 643 329 518  Diastolic BP 78 76 73  Wt. (Lbs) 219.6 215 212  BMI 28.97 28.37 27.59      Physical Exam  Constitutional: He is oriented  to person, place, and time. He appears well-developed and well-nourished.  Cardiovascular: Normal heart sounds and intact distal pulses. Tachycardia present.  No murmur heard. Pulmonary/Chest: Effort normal and breath sounds normal. He has no wheezes. He has no rales. He exhibits no tenderness.  Abdominal: Soft. Bowel sounds are normal. He exhibits no distension and no mass. There is no tenderness.  Musculoskeletal: Normal range of motion. He exhibits no tenderness.  Negative straight leg raise bilaterally  Neurological: He is alert  and oriented to person, place, and time.  Skin: Skin is warm and dry.  Psychiatric: He has a normal mood and affect.     Assessment & Plan:   1. Hyperlipidemia, unspecified hyperlipidemia type Uncontrolled Low-cholesterol diet, lifestyle modifications - atorvastatin (LIPITOR) 20 MG tablet; Take 1 tablet (20 mg total) by mouth daily.  Dispense: 30 tablet; Refill: 6 - CMP14+EGFR - Lipid panel  2. Essential hypertension Controlled Counseled on blood pressure goal of less than 130/80, low-sodium, DASH diet, medication compliance, 150 minutes of moderate intensity exercise per week. Discussed medication compliance, adverse effects. - losartan (COZAAR) 50 MG tablet; Take 1 tablet (50 mg total) by mouth daily.  Dispense: 30 tablet; Refill: 6  3. Other sleep apnea - Split night study; Future  4. Gastroesophageal reflux disease without esophagitis Controlled - omeprazole (PRILOSEC) 40 MG capsule; Take 1 capsule (40 mg total) by mouth daily.  Dispense: 30 capsule; Refill: 6  5. Nicotine abuse Spent 3 minutes discussing smoking cessation and he is not ready to quit at this time  6. Spinal stenosis of lumbar region with neurogenic claudication Controlled He currently is not taking his medications but will use them as needed    Meds ordered this encounter  Medications  . atorvastatin (LIPITOR) 20 MG tablet    Sig: Take 1 tablet (20 mg total) by mouth daily.    Dispense:  30 tablet    Refill:  6  . losartan (COZAAR) 50 MG tablet    Sig: Take 1 tablet (50 mg total) by mouth daily.    Dispense:  30 tablet    Refill:  6  . omeprazole (PRILOSEC) 40 MG capsule    Sig: Take 1 capsule (40 mg total) by mouth daily.    Dispense:  30 capsule    Refill:  6    Follow-up: Return in about 6 months (around 07/10/2018) for Follow-up of chronic medical conditions.   Charlott Rakes MD

## 2018-01-09 LAB — LIPID PANEL
CHOL/HDL RATIO: 2.2 ratio (ref 0.0–5.0)
Cholesterol, Total: 158 mg/dL (ref 100–199)
HDL: 72 mg/dL (ref >39)
LDL CALC: 73 mg/dL (ref 0–99)
Triglycerides: 64 mg/dL (ref 0–149)
VLDL CHOLESTEROL CAL: 13 mg/dL (ref 5–40)

## 2018-01-09 LAB — CMP14+EGFR
ALBUMIN: 4.3 g/dL (ref 3.5–5.5)
ALT: 18 IU/L (ref 0–44)
AST: 20 IU/L (ref 0–40)
Albumin/Globulin Ratio: 2.2 (ref 1.2–2.2)
Alkaline Phosphatase: 59 IU/L (ref 39–117)
BUN / CREAT RATIO: 20 (ref 9–20)
BUN: 18 mg/dL (ref 6–24)
Bilirubin Total: 0.5 mg/dL (ref 0.0–1.2)
CHLORIDE: 101 mmol/L (ref 96–106)
CO2: 25 mmol/L (ref 20–29)
Calcium: 9 mg/dL (ref 8.7–10.2)
Creatinine, Ser: 0.9 mg/dL (ref 0.76–1.27)
GFR, EST AFRICAN AMERICAN: 115 mL/min/{1.73_m2} (ref >59)
GFR, EST NON AFRICAN AMERICAN: 99 mL/min/{1.73_m2} (ref >59)
GLUCOSE: 95 mg/dL (ref 65–99)
Globulin, Total: 2 g/dL (ref 1.5–4.5)
Potassium: 4.7 mmol/L (ref 3.5–5.2)
Sodium: 140 mmol/L (ref 134–144)
TOTAL PROTEIN: 6.3 g/dL (ref 6.0–8.5)

## 2018-01-16 ENCOUNTER — Telehealth: Payer: Self-pay

## 2018-01-16 NOTE — Telephone Encounter (Signed)
-----   Message from Charlott Rakes, MD sent at 01/09/2018  4:48 PM EDT ----- Labs are normal and cholesterol has improved significantly compared to previous labs

## 2018-01-16 NOTE — Telephone Encounter (Signed)
Patient was called and informed of lab results. 

## 2018-01-28 ENCOUNTER — Encounter (HOSPITAL_COMMUNITY): Payer: Self-pay | Admitting: Emergency Medicine

## 2018-01-28 ENCOUNTER — Emergency Department (HOSPITAL_COMMUNITY)
Admission: EM | Admit: 2018-01-28 | Discharge: 2018-01-28 | Disposition: A | Discharge status: Home / Self Care | Payer: Self-pay | Attending: Emergency Medicine | Admitting: Emergency Medicine

## 2018-01-28 ENCOUNTER — Other Ambulatory Visit: Payer: Self-pay

## 2018-01-28 ENCOUNTER — Emergency Department (HOSPITAL_COMMUNITY): Payer: Self-pay

## 2018-01-28 DIAGNOSIS — Z79899 Other long term (current) drug therapy: Secondary | ICD-10-CM | POA: Insufficient documentation

## 2018-01-28 DIAGNOSIS — R42 Dizziness and giddiness: Secondary | ICD-10-CM | POA: Insufficient documentation

## 2018-01-28 DIAGNOSIS — I10 Essential (primary) hypertension: Secondary | ICD-10-CM | POA: Insufficient documentation

## 2018-01-28 DIAGNOSIS — F17210 Nicotine dependence, cigarettes, uncomplicated: Secondary | ICD-10-CM | POA: Insufficient documentation

## 2018-01-28 LAB — I-STAT TROPONIN, ED: Troponin i, poc: 0 ng/mL (ref 0.00–0.08)

## 2018-01-28 LAB — CBC
HCT: 47.3 % (ref 39.0–52.0)
HEMOGLOBIN: 14.9 g/dL (ref 13.0–17.0)
MCH: 27.1 pg (ref 26.0–34.0)
MCHC: 31.5 g/dL (ref 30.0–36.0)
MCV: 86 fL (ref 78.0–100.0)
Platelets: 190 10*3/uL (ref 150–400)
RBC: 5.5 MIL/uL (ref 4.22–5.81)
RDW: 14.6 % (ref 11.5–15.5)
WBC: 5.5 10*3/uL (ref 4.0–10.5)

## 2018-01-28 LAB — I-STAT CHEM 8, ED
BUN: 19 mg/dL (ref 6–20)
CREATININE: 1.2 mg/dL (ref 0.61–1.24)
Calcium, Ion: 1.07 mmol/L — ABNORMAL LOW (ref 1.15–1.40)
Chloride: 99 mmol/L (ref 98–111)
GLUCOSE: 134 mg/dL — AB (ref 70–99)
HCT: 48 % (ref 39.0–52.0)
Hemoglobin: 16.3 g/dL (ref 13.0–17.0)
Potassium: 3.9 mmol/L (ref 3.5–5.1)
SODIUM: 136 mmol/L (ref 135–145)
TCO2: 26 mmol/L (ref 22–32)

## 2018-01-28 LAB — DIFFERENTIAL
Abs Immature Granulocytes: 0 10*3/uL (ref 0.0–0.1)
BASOS PCT: 1 %
Basophils Absolute: 0 10*3/uL (ref 0.0–0.1)
EOS PCT: 4 %
Eosinophils Absolute: 0.2 10*3/uL (ref 0.0–0.7)
Immature Granulocytes: 1 %
Lymphocytes Relative: 30 %
Lymphs Abs: 1.7 10*3/uL (ref 0.7–4.0)
MONO ABS: 0.5 10*3/uL (ref 0.1–1.0)
Monocytes Relative: 9 %
NEUTROS ABS: 3.1 10*3/uL (ref 1.7–7.7)
Neutrophils Relative %: 55 %

## 2018-01-28 LAB — COMPREHENSIVE METABOLIC PANEL
ALT: 18 U/L (ref 0–44)
ANION GAP: 8 (ref 5–15)
AST: 22 U/L (ref 15–41)
Albumin: 3.7 g/dL (ref 3.5–5.0)
Alkaline Phosphatase: 44 U/L (ref 38–126)
BUN: 16 mg/dL (ref 6–20)
CO2: 26 mmol/L (ref 22–32)
Calcium: 8.9 mg/dL (ref 8.9–10.3)
Chloride: 102 mmol/L (ref 98–111)
Creatinine, Ser: 1.17 mg/dL (ref 0.61–1.24)
GFR calc non Af Amer: >60 mL/min (ref >60)
Glucose, Bld: 140 mg/dL — ABNORMAL HIGH (ref 70–99)
POTASSIUM: 4 mmol/L (ref 3.5–5.1)
Sodium: 136 mmol/L (ref 135–145)
Total Bilirubin: 0.7 mg/dL (ref 0.3–1.2)
Total Protein: 6.2 g/dL — ABNORMAL LOW (ref 6.5–8.1)

## 2018-01-28 LAB — PROTIME-INR
INR: 1.04
Prothrombin Time: 13.5 seconds (ref 11.4–15.2)

## 2018-01-28 LAB — APTT: APTT: 30 s (ref 24–36)

## 2018-01-28 NOTE — ED Triage Notes (Signed)
Pt c/o dizziness since last night. A&O x 4, no neuro deficits noted at this time. Hx HTN

## 2018-01-29 ENCOUNTER — Emergency Department (HOSPITAL_COMMUNITY)
Admission: EM | Admit: 2018-01-29 | Discharge: 2018-01-29 | Disposition: A | Discharge status: Home / Self Care | Payer: Self-pay | Source: Home / Self Care | Attending: Emergency Medicine | Admitting: Emergency Medicine

## 2018-01-29 ENCOUNTER — Encounter (HOSPITAL_COMMUNITY): Payer: Self-pay | Admitting: Emergency Medicine

## 2018-01-29 ENCOUNTER — Other Ambulatory Visit: Payer: Self-pay

## 2018-01-29 DIAGNOSIS — I10 Essential (primary) hypertension: Secondary | ICD-10-CM

## 2018-01-29 DIAGNOSIS — R42 Dizziness and giddiness: Secondary | ICD-10-CM

## 2018-01-29 NOTE — ED Provider Notes (Signed)
  1:29 AM Was going to evaluate patient and he was seen walking out of the ED.  Apparently, he was in the waiting room for several hours, accidentally discharged out and was never in the que to be brought back to room. Upon realization of mistake, he was brought back to hall bed for evaluation.  NT attempting to bring patient back so I could speak with him, however walked out of ED waiting room.  I did not get to see or evaluate patient.   Larene Pickett, PA-C 01/29/18 0144    Fatima Blank, MD 01/29/18 [phone]

## 2018-01-29 NOTE — ED Notes (Signed)
Pt left from ED and stopped at nurse first desk and notified Yvone Neu, EMT he was leaving.  Lattie Haw, PA was in the process of going to see pt.  Charge RN attempted to call pt's cell phone and it goes straight to voice mail.

## 2018-01-29 NOTE — ED Notes (Signed)
Jarrett Soho, [place] notified of pt and is now talking with pt.

## 2018-01-29 NOTE — ED Provider Notes (Signed)
[facility] EMERGENCY DEPARTMENT Provider Note   CSN: [id number] Arrival date & time: 01/29/18  0051     History   Chief Complaint Chief Complaint  Patient presents with  . Dizziness    HPI Timothy Sanchez is a 52 y.o. male with a hx of HTN presents to the Emergency Department complaining of gradual, interimttent "dizziness" onset more than 36 hours ago.  Pt reports compliance with his HTN and cholesterol medications (Lipitor and Cozzar).  He states yesterday morning his BP was 140/80.  After a frustrating day of deliveries, his BP was 160/90.  He reports he has had intermittent episodes of dizziness throughout the day on Saturday and Sunday.  He reports no room spinning or feeling of near syncope.  Pt is unable to describe his dizziness.  No specific aggravating or alleviating factors.  Specifically, position change, head position change, sitting, standing, walking did not aggravate or initiate the symptoms.  Pt reports he is symptom free at this time.  He denies CP, SOB, diaphoresis, weakness, syncope, nausea, vision changes, headache, neck pain, ringing in his ears, numbness, tingling at any point with these symptoms.  He denies URI symptoms, urinary symptoms, fever, chills.  Pt denies diet changes.  Pt reports he came to be checked because he was worried.    The history is provided by the patient and medical records. No language interpreter was used.    Past Medical History:  Diagnosis Date  . Hypertension     Patient Active Problem List   Diagnosis Date Noted  . Spinal stenosis, lumbar 11/30/2016  . Vitamin D deficiency 09/15/2015  . Hyperlipidemia 09/15/2015  . Hypertriglyceridemia 03/11/2014  . Essential hypertension 03/11/2014  . Essential hypertension, benign 03/28/2013  . GERD (gastroesophageal reflux disease) 03/28/2013  . Nicotine abuse 03/28/2013    History reviewed. No pertinent surgical history.      Home Medications    Prior to Admission  medications   Medication Sig Start Date End Date Taking? Authorizing Provider  atorvastatin (LIPITOR) 20 MG tablet Take 1 tablet (20 mg total) by mouth daily. 01/08/18   Charlott Rakes, MD  cetirizine (ZYRTEC) 10 MG tablet Take 1 tablet (10 mg total) by mouth daily. Patient not taking: Reported on 04/10/2017 11/30/16   Charlott Rakes, MD  gabapentin (NEURONTIN) 300 MG capsule Take 1 capsule (300 mg total) by mouth at bedtime. 08/30/17   Charlott Rakes, MD  losartan (COZAAR) 50 MG tablet Take 1 tablet (50 mg total) by mouth daily. 01/08/18   Charlott Rakes, MD  methocarbamol (ROBAXIN) 500 MG tablet Take 1 tablet (500 mg total) by mouth every 8 (eight) hours as needed for muscle spasms. 11/30/16   Charlott Rakes, MD  naproxen (NAPROSYN) 500 MG tablet Take 1 tablet (500 mg total) by mouth 2 (two) times daily. Patient not taking: Reported on 04/10/2017 11/30/16   Charlott Rakes, MD  omeprazole (PRILOSEC) 40 MG capsule Take 1 capsule (40 mg total) by mouth daily. 01/08/18   Charlott Rakes, MD  traMADol (ULTRAM) 50 MG tablet Take 1 tablet (50 mg total) by mouth every 12 (twelve) hours as needed. Patient not taking: Reported on 04/10/2017 12/23/16   Charlott Rakes, MD    Family History No family history on file.  Social History Social History   Tobacco Use  . Smoking status: Heavy Tobacco Smoker    Packs/day: 1.50    Years: 25.00    Pack years: 37.50    Types: Cigarettes  Start date: 03/26/2013  . Smokeless tobacco: Never Used  Substance Use Topics  . Alcohol use: Yes    Alcohol/week: 4.8 oz    Types: 7 Shots of liquor, 1 Cans of beer per week    Comment: daily  . Drug use: No     Allergies   Patient has no known allergies.   Review of Systems Review of Systems  Constitutional: Negative for appetite change, diaphoresis, fatigue, fever and unexpected weight change.  HENT: Negative for mouth sores.   Eyes: Negative for visual disturbance.  Respiratory: Negative for cough, chest  tightness, shortness of breath and wheezing.   Cardiovascular: Negative for chest pain.  Gastrointestinal: Negative for abdominal pain, constipation, diarrhea, nausea and vomiting.  Endocrine: Negative for polydipsia, polyphagia and polyuria.  Genitourinary: Negative for dysuria, frequency, hematuria and urgency.  Musculoskeletal: Negative for back pain and neck stiffness.  Skin: Negative for rash.  Allergic/Immunologic: Negative for immunocompromised state.  Neurological: Positive for dizziness ( resolved). Negative for syncope, light-headedness and headaches.  Hematological: Does not bruise/bleed easily.  Psychiatric/Behavioral: Negative for sleep disturbance. The patient is not nervous/anxious.      Physical Exam Updated Vital Signs BP (!) 151/102   Pulse 86   Temp 98.2 F (36.8 C) (Oral)   Resp 18   SpO2 100%   Physical Exam  Constitutional: He is oriented to person, place, and time. He appears well-developed and well-nourished. No distress.  Awake, alert, nontoxic appearance  HENT:  Head: Normocephalic and atraumatic.  Mouth/Throat: Oropharynx is clear and moist. No oropharyngeal exudate.  Eyes: Pupils are equal, round, and reactive to light. Conjunctivae and EOM are normal. No scleral icterus.  No horizontal, vertical or rotational nystagmus  Neck: Normal range of motion. Neck supple.  Full active and passive ROM without pain No midline or paraspinal tenderness No nuchal rigidity or meningeal signs  Cardiovascular: Normal rate, regular rhythm and intact distal pulses.  Pulmonary/Chest: Effort normal and breath sounds normal. No respiratory distress. He has no wheezes. He has no rales.  Equal chest expansion  Abdominal: Soft. Bowel sounds are normal. He exhibits no mass. There is no tenderness. There is no rebound and no guarding.  Musculoskeletal: Normal range of motion. He exhibits no edema.  Lymphadenopathy:    He has no cervical adenopathy.  Neurological: He is  alert and oriented to person, place, and time. No cranial nerve deficit. He exhibits normal muscle tone. Coordination normal.  Mental Status:  Alert, oriented, thought content appropriate. Speech fluent without evidence of aphasia. Able to follow 2 step commands without difficulty.  Cranial Nerves:  II:  Peripheral visual fields grossly normal, pupils equal, round, reactive to light III,IV, VI: ptosis not present, extra-ocular motions intact bilaterally  V,VII: smile symmetric, facial light touch sensation equal VIII: hearing grossly normal bilaterally  IX,X: midline uvula rise  XI: bilateral shoulder shrug equal and strong XII: midline tongue extension  Motor:  5/5 in upper and lower extremities bilaterally including strong and equal grip strength and dorsiflexion/plantar flexion Sensory: Pinprick and light touch normal in all extremities.  Cerebellar: normal finger-to-nose with bilateral upper extremities Gait: normal gait and balance CV: distal pulses palpable throughout   Skin: Skin is warm and dry. No rash noted. He is not diaphoretic.  Psychiatric: He has a normal mood and affect. His behavior is normal. Judgment and thought content normal.  Nursing note and vitals reviewed.    ED Treatments / Results   Results for orders placed or performed during  the hospital encounter of 01/28/18  Protime-INR  Result Value Ref Range   Prothrombin Time 13.5 11.4 - 15.2 seconds   INR 1.04   APTT  Result Value Ref Range   aPTT 30 24 - 36 seconds  CBC  Result Value Ref Range   WBC 5.5 4.0 - 10.5 K/uL   RBC 5.50 4.22 - 5.81 MIL/uL   Hemoglobin 14.9 13.0 - 17.0 g/dL   HCT 47.3 39.0 - 52.0 %   MCV 86.0 78.0 - 100.0 fL   MCH 27.1 26.0 - 34.0 pg   MCHC 31.5 30.0 - 36.0 g/dL   RDW 14.6 11.5 - 15.5 %   Platelets 190 150 - 400 K/uL  Differential  Result Value Ref Range   Neutrophils Relative % 55 %   Neutro Abs 3.1 1.7 - 7.7 K/uL   Lymphocytes Relative 30 %   Lymphs Abs 1.7 0.7 - 4.0  K/uL   Monocytes Relative 9 %   Monocytes Absolute 0.5 0.1 - 1.0 K/uL   Eosinophils Relative 4 %   Eosinophils Absolute 0.2 0.0 - 0.7 K/uL   Basophils Relative 1 %   Basophils Absolute 0.0 0.0 - 0.1 K/uL   Immature Granulocytes 1 %   Abs Immature Granulocytes 0.0 0.0 - 0.1 K/uL  Comprehensive metabolic panel  Result Value Ref Range   Sodium 136 135 - 145 mmol/L   Potassium 4.0 3.5 - 5.1 mmol/L   Chloride 102 98 - 111 mmol/L   CO2 26 22 - 32 mmol/L   Glucose, Bld 140 (H) 70 - 99 mg/dL   BUN 16 6 - 20 mg/dL   Creatinine, Ser 1.17 0.61 - 1.24 mg/dL   Calcium 8.9 8.9 - 10.3 mg/dL   Total Protein 6.2 (L) 6.5 - 8.1 g/dL   Albumin 3.7 3.5 - 5.0 g/dL   AST 22 15 - 41 U/L   ALT 18 0 - 44 U/L   Alkaline Phosphatase 44 38 - 126 U/L   Total Bilirubin 0.7 0.3 - 1.2 mg/dL   GFR calc non Af Amer >60 >60 mL/min   GFR calc Af Amer >60 >60 mL/min   Anion gap 8 5 - 15  I-stat troponin, ED  Result Value Ref Range   Troponin i, poc 0.00 0.00 - 0.08 ng/mL   Comment 3          I-Stat Chem 8, ED  Result Value Ref Range   Sodium 136 135 - 145 mmol/L   Potassium 3.9 3.5 - 5.1 mmol/L   Chloride 99 98 - 111 mmol/L   BUN 19 6 - 20 mg/dL   Creatinine, Ser 1.20 0.61 - 1.24 mg/dL   Glucose, Bld 134 (H) 70 - 99 mg/dL   Calcium, Ion 1.07 (L) 1.15 - 1.40 mmol/L   TCO2 26 22 - 32 mmol/L   Hemoglobin 16.3 13.0 - 17.0 g/dL   HCT 48.0 39.0 - 52.0 %    Radiology Ct Head Wo Contrast  Result Date: 01/28/2018 CLINICAL DATA:  Dizziness.  Elevated blood pressure. EXAM: CT HEAD WITHOUT CONTRAST TECHNIQUE: Contiguous axial images were obtained from the base of the skull through the vertex without intravenous contrast. COMPARISON:  None. FINDINGS: Brain: No subdural, epidural, or subarachnoid hemorrhage. Ventricles are normal. Sulci are mildly prominent consistent volume loss. Cerebellum, brainstem, and basal cisterns are normal. No mass effect or midline shift. No acute cortical ischemia or infarct. Vascular: No  hyperdense vessel or unexpected calcification. Skull: Normal. Negative for fracture or focal lesion.  Sinuses/Orbits: Mucosal thickening and partial opacification of the left maxillary sinus. Mucous retention cyst in the right maxillary sinus and left sphenoid sinus. Scattered opacification of ethmoid air cells and mucosal thickening in the frontal sinuses. No air-fluid levels. Mastoid air cells and middle ears are well aerated. Other: None. IMPRESSION: 1. Sinus disease as above. 2. No acute intracranial abnormalities. Electronically Signed   By: Dorise Bullion III M.D   On: 01/28/2018 19:43   ED ECG REPORT   Date: 01/28/2018 @ 18:27  Rate: 105  Rhythm: sinus tachycardia  QRS Axis: normal  Intervals: normal  ST/T Wave abnormalities: normal  Conduction Disutrbances:none  Narrative Interpretation: Sinus tach, no ischemia  Old EKG Reviewed: none available  I have personally reviewed the EKG tracing and agree with the computerized printout as noted.   Procedures Procedures (including critical care time)  Medications Ordered in ED Medications - No data to display   Initial Impression / Assessment and Plan / ED Course  I have reviewed the triage vital signs and the nursing notes.  Pertinent labs & imaging results that were available during my care of the patient were reviewed by me and considered in my medical decision making (see chart for details).  Clinical Course as of Jan 29 600  Mon Jan 29, 2018  0258 Her is elevated but has improved.  Patient reports he no longer feels dizzy.  BP(!): 141/102 [HM]    Clinical Course User Index [HM] Myldred Raju, Jarrett Soho, PA-C    Presents with complaints of hypertension and dizziness.  Dizziness has been intermittent and resolved prior to my evaluation of the patient and has not returned.  Patient with hypertension here in the emergency department.  He denies vision changes.  He has a normal neurologic exam.  Labs are without evidence of endorgan  damage.  Normal troponin, no elevation in serum creatinine.  CT scan of his head is without acute abnormality including intracranial hemorrhage or intracranial mass.  I personally evaluated these images.  EKG is nonischemic.  At triage, patient was tachycardic however has not had any persistent tachycardia here in the emergency department.  He is adamant that he has had no chest pain or shortness of breath.  No leg swelling.  I doubt pulmonary embolism as the cause for his dizziness and hypertension.  Blood pressure has improved while here in the emergency department.  Suspect stress may play a role in patient's symptoms.  Requested patient follow-up with primary care physician today for further evaluation, repeat blood pressures and potential adjustment of his medications.  Discussed reasons to return immediately to the emergency department including return of symptoms, development of chest pain, feeling of near syncope or other concerns.  Patient states understanding and is in agreement with this plan.  ECG reviewed by Dr. Leonette Monarch.    Final Clinical Impressions(s) / ED Diagnoses   Final diagnoses:  Dizziness  Essential hypertension    ED Discharge Orders    None       Loni Muse Gwenlyn Perking 01/29/18 0601    Fatima Blank, MD 01/29/18 [phone]

## 2018-01-29 NOTE — ED Notes (Signed)
Charge RN asked Dr. Randal Buba to eval pt and explained that pt has been in waiting room for hours and was accidentally discharged.

## 2018-01-29 NOTE — Discharge Instructions (Addendum)
1. Medications: Take usual home medications as directed 2. Treatment: rest, drink plenty of fluids,  3. Follow Up: Please followup with your primary doctor in 1-2 days for discussion of your diagnoses and further evaluation after today's visit; if you do not have a primary care doctor use the resource guide provided to find one; Please return to the ER for chest pain, syncope, worsening symptoms, changes in vision or other concerns.

## 2018-01-29 NOTE — ED Triage Notes (Addendum)
PT was accidentally discharged out.  Pt is still in ED

## 2018-01-29 NOTE — ED Triage Notes (Signed)
Pt c/o dizziness since last night. A&O x 4, no neuro deficits noted at this time. Hx HTN

## 2018-01-29 NOTE — ED Triage Notes (Signed)
Pt was accidentally discharged out of system earlier but has remained in ED waiting room.  He reports dizziness since Saturday.  No neuro deficits.  Labs and CT already completed.

## 2018-01-29 NOTE — ED Notes (Addendum)
Pt was moved to D hall bed and registration unable to un-discharge pt from previous encounter.  Pt was walking out.  Charge RN spoke with pt and he states he is leaving.  Charge RN encouraged pt to stay.  Lattie Haw, [place] notified of patient and will see pt.

## 2018-01-29 NOTE — ED Notes (Addendum)
Pt verbalizes understanding of d/c instructions. Pt ambulatory at d/c with all belongings. RN apologized for delays and wait.

## 2018-01-29 NOTE — ED Notes (Signed)
Patient was discharged from waiting room after staff couldn't find him. He came up to the desk to inquire about the wait after about 5 hours.   Patient has just been reregistered.  Patient is angry and wants to leave.  I explained that we were very sorry for the error (no matter who's fault is was) and we would like for him to stay and be seen; that we could not make him stay.

## 2018-01-29 NOTE — ED Notes (Signed)
PT is back in the lobby and is being brought back to the hallway.

## 2018-01-31 ENCOUNTER — Ambulatory Visit: Payer: Self-pay | Attending: Family Medicine | Admitting: Family Medicine

## 2018-01-31 ENCOUNTER — Encounter: Payer: Self-pay | Admitting: Family Medicine

## 2018-01-31 DIAGNOSIS — K219 Gastro-esophageal reflux disease without esophagitis: Secondary | ICD-10-CM | POA: Insufficient documentation

## 2018-01-31 DIAGNOSIS — Z720 Tobacco use: Secondary | ICD-10-CM | POA: Insufficient documentation

## 2018-01-31 DIAGNOSIS — M48061 Spinal stenosis, lumbar region without neurogenic claudication: Secondary | ICD-10-CM | POA: Insufficient documentation

## 2018-01-31 DIAGNOSIS — I10 Essential (primary) hypertension: Secondary | ICD-10-CM | POA: Insufficient documentation

## 2018-01-31 DIAGNOSIS — E785 Hyperlipidemia, unspecified: Secondary | ICD-10-CM | POA: Insufficient documentation

## 2018-01-31 MED ORDER — METOPROLOL TARTRATE 25 MG PO TABS: 25.0000 mg | ORAL_TABLET | Freq: Two times a day (BID) | ORAL | 3 refills | Status: DC

## 2018-01-31 MED FILL — LOSARTAN POTASSIUM 50 MG TA: 50 | 30 days supply | Qty: 30 | Fill #5

## 2018-01-31 MED FILL — ATORVASTATIN 20 MG TABLET: 20 | 30 days supply | Qty: 30 | Fill #2

## 2018-01-31 MED FILL — METOPROLOL TARTRATE 25 MG T: 25 | 30 days supply | Qty: 60 | Fill #0

## 2018-01-31 NOTE — Patient Instructions (Signed)

## 2018-01-31 NOTE — Progress Notes (Signed)
Subjective:  Patient ID: Timothy Sanchez, male    DOB: [birth year]  Age: 52 y.o. 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  He was found to have slightly elevated blood pressure on presentation, labs were normal, EKG unremarkable. CT head revealed sinus disease, negative for acute intracranial process and he was subsequently discharged.  He informs me dizziness has been going on for 2 to 3 days and he had had persistently elevated blood pressure when he checked at [facility] and that prompted his presentation to the ED.  Dizziness has since subsided and he denies tinnitus, vertigo, chest pains or shortness of breath and has been compliant with his medications.  Past Medical History:  Diagnosis Date  . Hypertension     History reviewed. No pertinent surgical history.  No Known Allergies   Outpatient Medications Prior to Visit  Medication Sig Dispense Refill  . atorvastatin (LIPITOR) 20 MG tablet Take 1 tablet (20 mg total) by mouth daily. 30 tablet 6  . losartan (COZAAR) 50 MG tablet Take 1 tablet (50 mg total) by mouth daily. 30 tablet 6  . cetirizine (ZYRTEC) 10 MG tablet Take 1 tablet (10 mg total) by mouth daily. (Patient not taking: Reported on 04/10/2017) 30 tablet 1  . gabapentin (NEURONTIN) 300 MG capsule Take 1 capsule (300 mg total) by mouth at bedtime. (Patient not taking: Reported on 01/31/2018) 60 capsule 3  . methocarbamol (ROBAXIN) 500 MG tablet Take 1 tablet (500 mg total) by mouth every 8 (eight) hours as needed for muscle spasms. (Patient not taking: Reported on 01/31/2018) 90 tablet 1  . naproxen (NAPROSYN) 500 MG tablet Take 1 tablet  (500 mg total) by mouth 2 (two) times daily. (Patient not taking: Reported on 04/10/2017) 60 tablet 1  . omeprazole (PRILOSEC) 40 MG capsule Take 1 capsule (40 mg total) by mouth daily. (Patient not taking: Reported on 01/31/2018) 30 capsule 6  . traMADol (ULTRAM) 50 MG tablet Take 1 tablet (50 mg total) by mouth every 12 (twelve) hours as needed. (Patient not taking: Reported on 04/10/2017) 60 tablet 0   No facility-administered medications prior to visit.     ROS Review of Systems  Constitutional: Negative for activity change and appetite change.  HENT: Negative for sinus pressure and sore throat.   Eyes: Negative for visual disturbance.  Respiratory: Negative for cough, chest tightness and shortness of breath.   Cardiovascular: Negative for chest pain and leg swelling.  Gastrointestinal: Negative for abdominal distention, abdominal pain, constipation and diarrhea.  Endocrine: Negative.   Genitourinary: Negative for dysuria.  Musculoskeletal: Negative for joint swelling and myalgias.  Skin: Negative for rash.  Allergic/Immunologic: Negative.   Neurological: Negative for weakness, light-headedness and numbness.  Psychiatric/Behavioral: Negative for dysphoric mood and suicidal ideas.    Objective:  BP (!) 143/82   Pulse 100   Temp 98.2 F (36.8 C) (Oral)   Ht 6\' 1"  (1.854 m)   Wt 215 lb 12.8 oz (97.9 kg)   SpO2 96%   BMI 28.47 kg/m   BP/Weight 01/31/2018 10/23/9377 0/08/4095  Systolic BP 353 299 242  Diastolic BP 82 683 92  Wt. (Lbs) 215.8 -  200  BMI 28.47 - 26.39      Physical Exam  Constitutional: He is oriented to person, place, and time. He appears well-developed and well-nourished.  Cardiovascular: Normal rate, normal heart sounds and intact distal pulses.  No murmur heard. Pulmonary/Chest: Effort normal and breath sounds normal. He has no wheezes. He has no rales. He exhibits no tenderness.  Abdominal: Soft. Bowel sounds are normal. He exhibits no distension and no  mass. There is no tenderness.  Musculoskeletal: Normal range of motion.  Neurological: He is alert and oriented to person, place, and time.  Skin: Skin is warm and dry.  Psychiatric: He has a normal mood and affect.     CMP Latest Ref Rng & Units 01/28/2018 01/28/2018 01/08/2018  Glucose 70 - 99 mg/dL 134(H) 140(H) 95  BUN 6 - 20 mg/dL 19 16 18   Creatinine 0.61 - 1.24 mg/dL 1.20 1.17 0.90  Sodium 135 - 145 mmol/L 136 136 140  Potassium 3.5 - 5.1 mmol/L 3.9 4.0 4.7  Chloride 98 - 111 mmol/L 99 102 101  CO2 22 - 32 mmol/L - 26 25  Calcium 8.9 - 10.3 mg/dL - 8.9 9.0  Total Protein 6.5 - 8.1 g/dL - 6.2(L) 6.3  Total Bilirubin 0.3 - 1.2 mg/dL - 0.7 0.5  Alkaline Phos 38 - 126 U/L - 44 59  AST 15 - 41 U/L - 22 20  ALT 0 - 44 U/L - 18 18    Assessment & Plan:   1. Essential hypertension Slightly elevated BP We will add metoprolol to regimen due to  history of tachycardia Counseled on blood pressure goal of less than 130/80, low-sodium, DASH diet, medication compliance, 150 minutes of moderate intensity exercise per week. Discussed medication compliance, adverse effects. - metoprolol tartrate (LOPRESSOR) 25 MG tablet; Take 1 tablet (25 mg total) by mouth 2 (two) times daily.  Dispense: 60 tablet; Refill: 3   Meds ordered this encounter  Medications  . metoprolol tartrate (LOPRESSOR) 25 MG tablet    Sig: Take 1 tablet (25 mg total) by mouth 2 (two) times daily.    Dispense:  60 tablet    Refill:  3    Follow-up: Return for Follow-up of chronic medical conditions, keep previously scheduled appointment.   Charlott Rakes MD

## 2018-02-02 ENCOUNTER — Inpatient Hospital Stay: Payer: Self-pay | Admitting: Family Medicine

## 2018-02-06 ENCOUNTER — Ambulatory Visit (HOSPITAL_BASED_OUTPATIENT_CLINIC_OR_DEPARTMENT_OTHER): Payer: Self-pay

## 2018-03-01 ENCOUNTER — Other Ambulatory Visit: Payer: Self-pay | Admitting: Family Medicine

## 2018-03-01 DIAGNOSIS — I10 Essential (primary) hypertension: Secondary | ICD-10-CM

## 2018-03-01 MED FILL — METOPROLOL TARTRATE 25 MG T: 25 | 30 days supply | Qty: 60 | Fill #1

## 2018-03-01 MED FILL — ATORVASTATIN 20 MG TABLET: 20 | 30 days supply | Qty: 30 | Fill #3

## 2018-03-01 MED FILL — LOSARTAN POTASSIUM 50 MG TA: 50 | 30 days supply | Qty: 30 | Fill #6

## 2018-03-28 MED FILL — METOPROLOL TARTRATE 25 MG T: 25 | 30 days supply | Qty: 60 | Fill #2

## 2018-03-28 MED FILL — LOSARTAN POTASSIUM 50 MG TA: 50 | 30 days supply | Qty: 30 | Fill #2

## 2018-04-26 MED FILL — METOPROLOL TARTRATE 25 MG T: 25 | 30 days supply | Qty: 60 | Fill #3

## 2018-05-01 MED FILL — LOSARTAN POTASSIUM 50 MG TA: 50 | 30 days supply | Qty: 30 | Fill #0

## 2018-05-18 MED FILL — OMEPRAZOLE DR 40 MG CAPSULE: 40 | 30 days supply | Qty: 30 | Fill #2

## 2018-05-28 ENCOUNTER — Other Ambulatory Visit: Payer: Self-pay | Admitting: Family Medicine

## 2018-05-28 DIAGNOSIS — I10 Essential (primary) hypertension: Secondary | ICD-10-CM

## 2018-05-29 MED FILL — METOPROLOL TARTRATE 25 MG T: 25 | 30 days supply | Qty: 60 | Fill #0

## 2018-06-05 MED FILL — LOSARTAN POTASSIUM 50 MG TA: 50 | 30 days supply | Qty: 30 | Fill #1

## 2018-06-26 ENCOUNTER — Other Ambulatory Visit: Payer: Self-pay | Admitting: Family Medicine

## 2018-06-26 DIAGNOSIS — I10 Essential (primary) hypertension: Secondary | ICD-10-CM

## 2018-06-26 MED FILL — OMEPRAZOLE DR 40 MG CAPSULE: 40 | 30 days supply | Qty: 30 | Fill #3

## 2018-06-26 MED FILL — LOSARTAN POTASSIUM 50 MG TA: 50 | 30 days supply | Qty: 30 | Fill #2

## 2018-07-11 ENCOUNTER — Ambulatory Visit: Payer: Self-pay | Admitting: Family Medicine

## 2018-07-30 MED FILL — LOSARTAN POTASSIUM 50 MG TA: 50 | 30 days supply | Qty: 30 | Fill #3

## 2018-08-29 MED FILL — LOSARTAN POTASSIUM 50 MG TA: 50 | 30 days supply | Qty: 30 | Fill #4

## 2018-08-31 MED FILL — METOPROLOL TARTRATE 25 MG T: 25 | 30 days supply | Qty: 60 | Fill #0

## 2018-09-06 ENCOUNTER — Ambulatory Visit: Payer: Self-pay | Attending: Family Medicine | Admitting: Physician Assistant

## 2018-09-06 VITALS — BP 149/89 | HR 88 | Temp 99.2°F | Ht 73.0 in | Wt 213.8 lb

## 2018-09-06 DIAGNOSIS — J329 Chronic sinusitis, unspecified: Secondary | ICD-10-CM

## 2018-09-06 DIAGNOSIS — I10 Essential (primary) hypertension: Secondary | ICD-10-CM

## 2018-09-06 MED ORDER — FLUTICASONE PROPIONATE 50 MCG/ACT NA SUSP: 2.0000 | Freq: Every day | NASAL | 6 refills | Status: DC

## 2018-09-06 MED ORDER — AMOXICILLIN 500 MG PO CAPS: 500.0000 mg | ORAL_CAPSULE | Freq: Three times a day (TID) | ORAL | 0 refills | Status: DC

## 2018-09-06 MED FILL — AMOXICILLIN 500 MG CAPSULE: 500 | 10 days supply | Qty: 30 | Fill #0

## 2018-09-06 MED FILL — FLUTICASONE PROP 50 MCG SPR: 50 | 30 days supply | Qty: 16 | Fill #0

## 2018-09-06 NOTE — Progress Notes (Signed)
Patient ID: Timothy Sanchez, male   DOB: 09-May-1966, 53 y.o.   MRN: [id number]   Timothy Sanchez, is a 53 y.o. male  [id number]:[id number]  BJS:[id number]  DOB - 10/11/1965  Subjective:  Chief Complaint and HPI: Timothy Sanchez is a 53 y.o. male here today with sinus pain and pressure and congestion X 1 month and getting worse.  Hasn't taken BP meds today.  Purulent drainage when he blows his nose.  No f/c.  Occasional cough.  No OTC.  ROS:   Constitutional:  No f/c, No night sweats, No unexplained weight loss. EENT:  No vision changes, No blurry vision, No hearing changes. Respiratory: mild cough, No SOB Cardiac: No CP, no palpitations GI:  No abd pain, No N/V/D. GU: No Urinary s/sx Musculoskeletal: No joint pain Neuro: +sinus headache, no dizziness, no motor weakness.  Skin: No rash Endocrine:  No polydipsia. No polyuria.  Psych: Denies SI/HI  No problems updated.  ALLERGIES: No Known Allergies  PAST MEDICAL HISTORY: Past Medical History:  Diagnosis Date  . Hypertension     MEDICATIONS AT HOME: Prior to Admission medications   Medication Sig Start Date End Date Taking? Authorizing Provider  losartan (COZAAR) 50 MG tablet Take 1 tablet (50 mg total) by mouth daily. 01/08/18  Yes Charlott Rakes, MD  metoprolol tartrate (LOPRESSOR) 25 MG tablet TAKE 1 TABLET (25 MG TOTAL) BY MOUTH 2 (TWO) TIMES DAILY. MUST MAKE APPT FOR FURTHER REFILLS 06/28/18  Yes Charlott Rakes, MD  amoxicillin (AMOXIL) 500 MG capsule Take 1 capsule (500 mg total) by mouth 3 (three) times daily. 09/06/18   Argentina Donovan, PA-C  atorvastatin (LIPITOR) 20 MG tablet Take 1 tablet (20 mg total) by mouth daily. Patient not taking: Reported on 09/06/2018 01/08/18   Charlott Rakes, MD  cetirizine (ZYRTEC) 10 MG tablet Take 1 tablet (10 mg total) by mouth daily. Patient not taking: Reported on 04/10/2017 11/30/16   Charlott Rakes, MD  fluticasone (FLONASE) 50 MCG/ACT nasal spray Place 2 sprays into both nostrils daily.  09/06/18   Argentina Donovan, PA-C     Objective:  EXAM:   Vitals:   09/06/18 1357  BP: (!) 149/89  Pulse: 88  Temp: 99.2 F (37.3 C)  TempSrc: Oral  SpO2: 95%  Weight: 213 lb 12.8 oz (97 kg)  Height: 6\' 1"  (1.854 m)    General appearance : A&OX3. NAD. Non-toxic-appearing HEENT: Atraumatic and Normocephalic.  PERRLA. EOM intact.  TM full B.  Maxillary TTP.  Mouth-MMM, post pharynx WNL w/o erythema, + PND. Neck: supple, no JVD. No cervical lymphadenopathy. No thyromegaly Chest/Lungs:  Breathing-non-labored, Good air entry bilaterally, breath sounds normal without rales, rhonchi, or wheezing  CVS: S1 S2 regular, no murmurs, gallops, rubs . Extremities: Bilateral Lower Ext shows no edema, both legs are warm to touch with = pulse throughout Neurology:  CN II-XII grossly intact, Non focal.   Psych:  TP linear. J/I WNL. Normal speech. Appropriate eye contact and affect.  Skin:  No Rash  Data Review Lab Results  Component Value Date   HGBA1C 5.60 09/15/2015   HGBA1C 6.3 (H) 04/14/2010     Assessment & Plan   1. Sinusitis, unspecified chronicity, unspecified location Fluids, rest, respiratory care - amoxicillin (AMOXIL) 500 MG capsule; Take 1 capsule (500 mg total) by mouth 3 (three) times daily.  Dispense: 30 capsule; Refill: 0 - fluticasone (FLONASE) 50 MCG/ACT nasal spray; Place 2 sprays into both nostrils daily.  Dispense: 16 g; Refill: 6  2.  Essential hypertension, benign Uncontrolled but hasn't taken meds today.  Take meds.  We have discussed target BP range and blood pressure goal. I have advised patient to check BP regularly and to call us back or report to clinic if the numbers are consistently higher than 140/90. We discussed the importance of compliance with medical therapy and DASH diet recommended, consequences of uncontrolled hypertension discussed.        Patient have been counseled extensively about nutrition and exercise  Return in about 3 months (around  12/05/2018) for Dr Margarita Rana; BP/cholesterol/fasting labs.  The patient was given clear instructions to go to ER or return to medical center if symptoms don't improve, worsen or new problems develop. The patient verbalized understanding. The patient was told to call to get lab results if they haven't heard anything in the next week.     Freeman Caldron, PA-C [facility] and [place], [place]   09/06/2018, 2:07 PM

## 2018-09-06 NOTE — Progress Notes (Signed)
Pt. Stated he is here for hypertension.  Pt. Stated he is sneezing a lot and have green mucus.

## 2018-10-03 ENCOUNTER — Other Ambulatory Visit: Payer: Self-pay | Admitting: Family Medicine

## 2018-10-03 DIAGNOSIS — I10 Essential (primary) hypertension: Secondary | ICD-10-CM

## 2018-10-03 MED FILL — LOSARTAN POTASSIUM 50 MG TA: 50 | 30 days supply | Qty: 30 | Fill #5

## 2018-10-03 NOTE — Telephone Encounter (Signed)
1) Medication(s) Requested (by name): metoprolol tartrate (LOPRESSOR) 25 MG tablet   2) Pharmacy of Choice: [facility] 3) Special Requests:   Approved medications will be sent to the pharmacy, we will reach out if there is an issue.  Requests made after 3pm may not be addressed until the following business day!  If a patient is unsure of the name of the medication(s) please note and ask patient to call back when they are able to provide all info, do not send to responsible party until all information is available!

## 2018-10-04 MED FILL — METOPROLOL TARTRATE 25 MG T: 25 | 30 days supply | Qty: 60 | Fill #0

## 2018-10-30 MED FILL — LOSARTAN POTASSIUM 50 MG TA: 50 | 30 days supply | Qty: 30 | Fill #6

## 2018-10-30 MED FILL — METOPROLOL TARTRATE 25 MG T: 25 | 30 days supply | Qty: 60 | Fill #1

## 2018-11-22 ENCOUNTER — Other Ambulatory Visit: Payer: Self-pay | Admitting: Family Medicine

## 2018-11-22 DIAGNOSIS — I10 Essential (primary) hypertension: Secondary | ICD-10-CM

## 2018-11-23 MED FILL — LOSARTAN POTASSIUM 50 MG TA: 50 | 30 days supply | Qty: 30 | Fill #0

## 2018-12-02 IMAGING — CT CT HEAD W/O CM
3 of 4 series · 14 of 47 positions shown, 16 images · non-contrast
Comparison: None.

CLINICAL DATA: Dizziness.  Elevated blood pressure.

EXAM:
CT HEAD WITHOUT CONTRAST
TECHNIQUE: Contiguous axial images were obtained from the base of the skull
through the vertex without intravenous contrast.

[Series 4: head 2.0 h70h · axial · 0.44mm/px · z∈[-93,+21]mm · 8 of 73 slices shown, 10 images]
[im 8/73  brain]
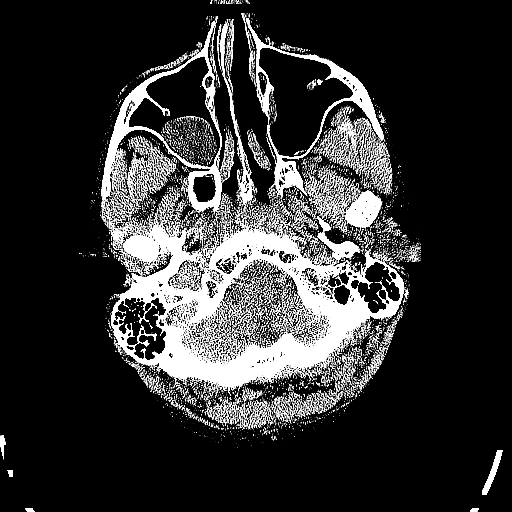
[im 8/73  bone]
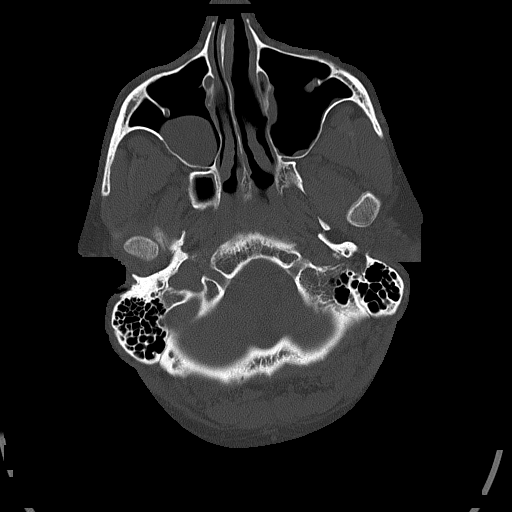
[im 15/73  brain]
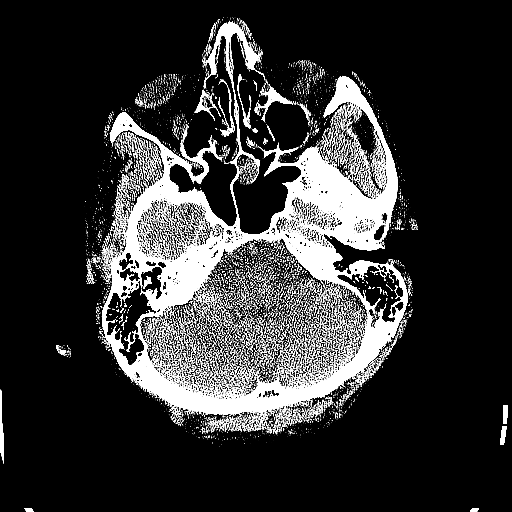
[im 22/73  brain]
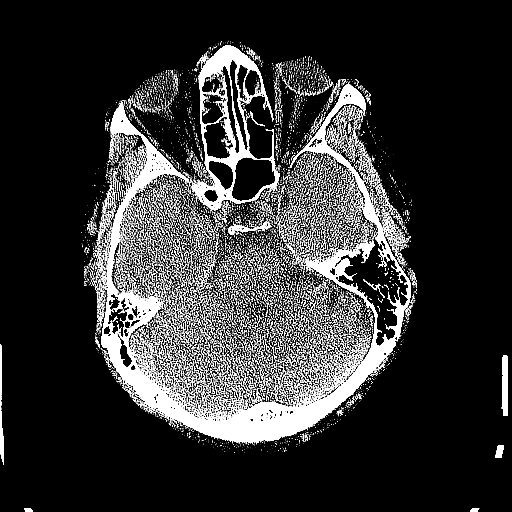
[im 33/73  brain]
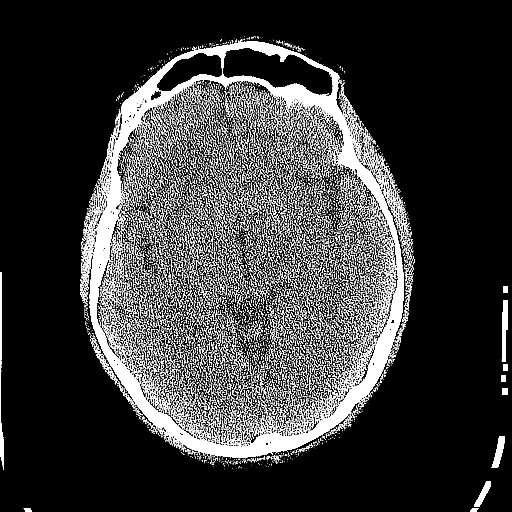
[im 40/73  brain]
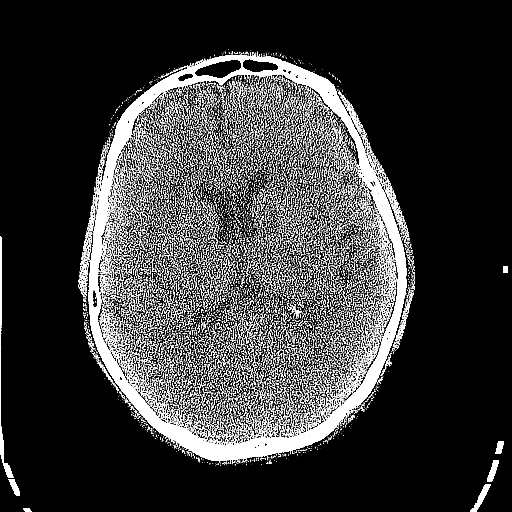
[im 40/73  bone]
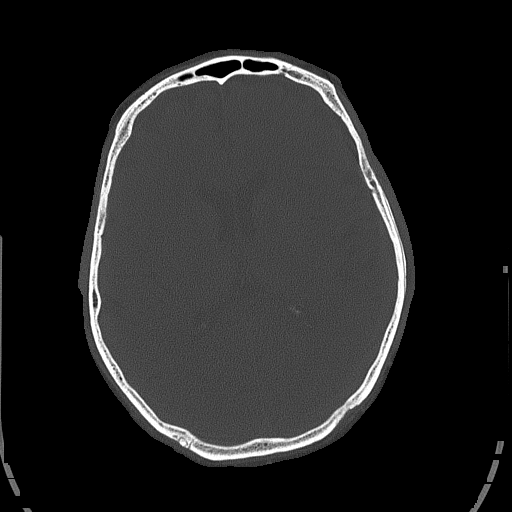
[im 51/73  brain]
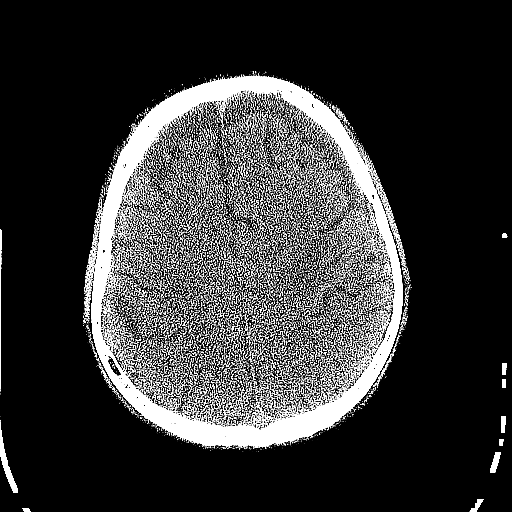
[im 58/73  brain]
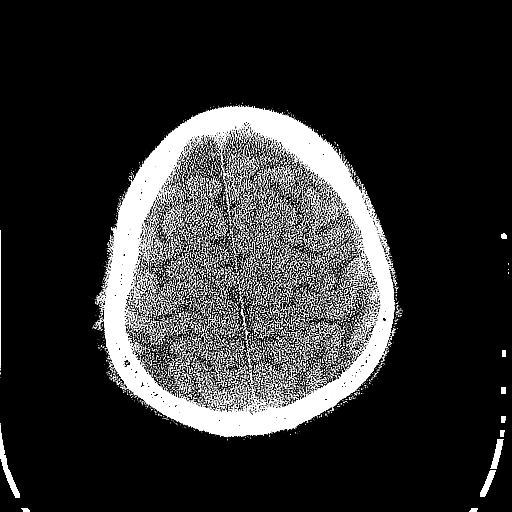
[im 65/73  brain]
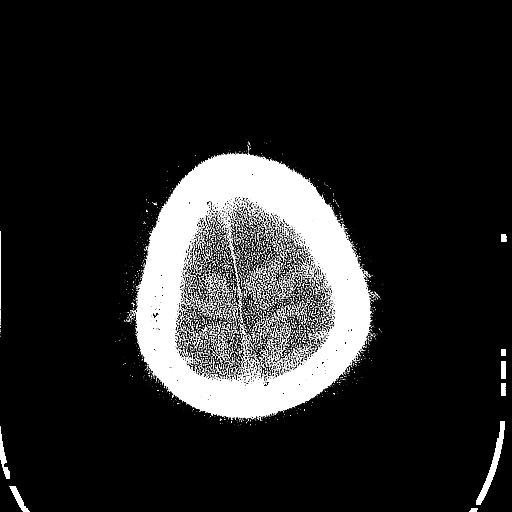

[Series 5: head 3.0 mpr cor · coronal · 0.28mm/px · 3 of 73 slices shown]
[im 25/73  brain]
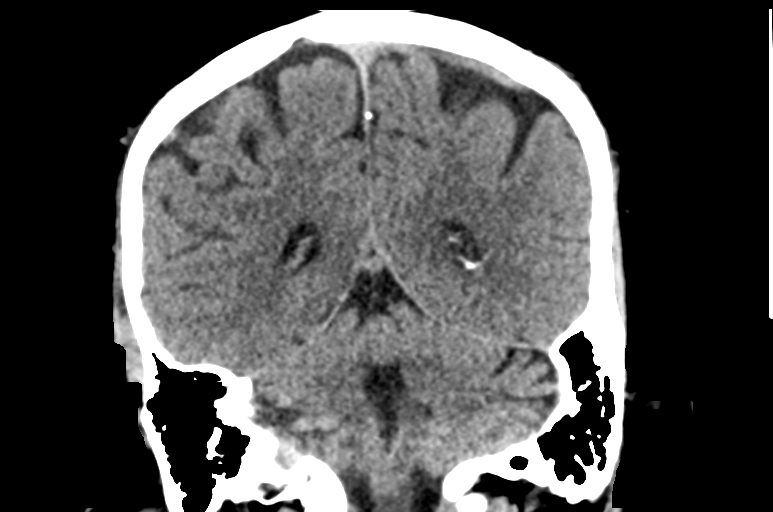
[im 33/73  brain]
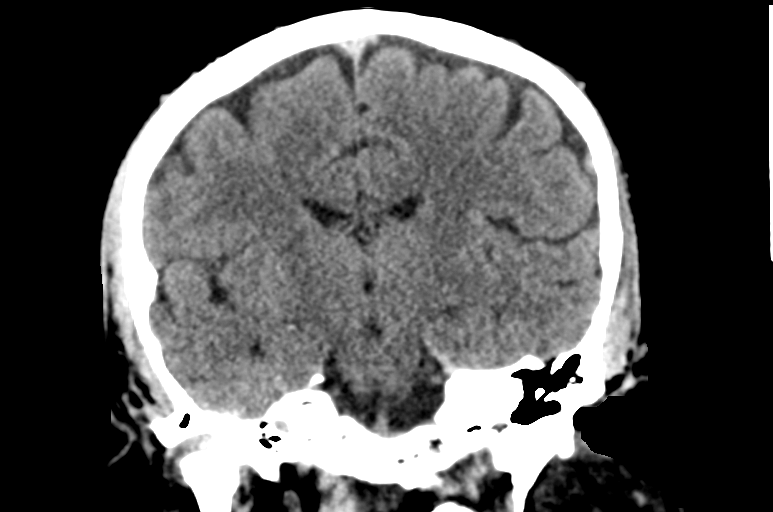
[im 41/73  brain]
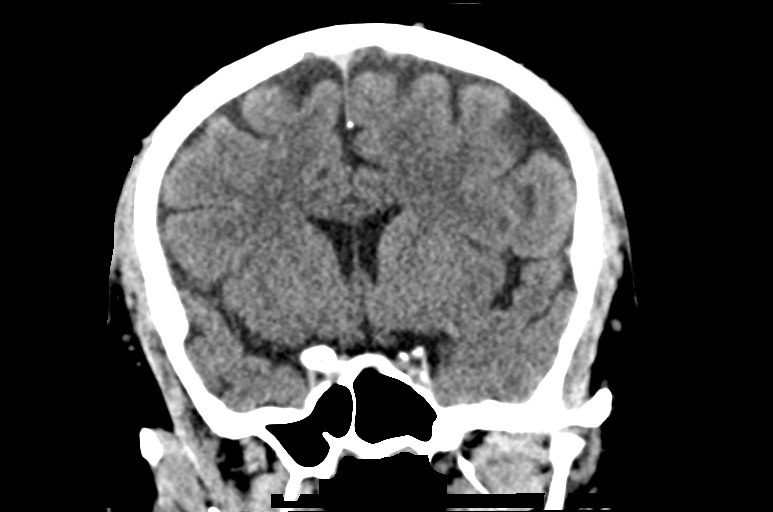

[Series 6: head 3.0 mpr sag · sagittal · 0.28mm/px · 3 of 67 slices shown]
[im 23/67  brain]
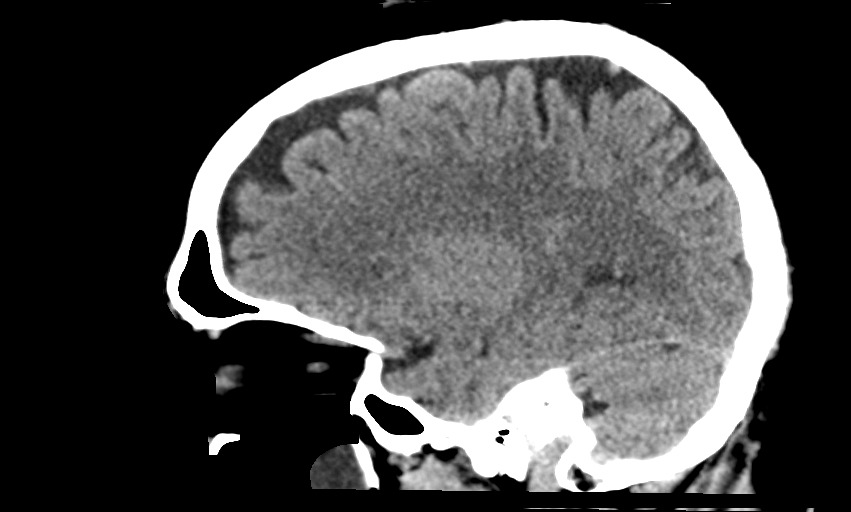
[im 34/67  brain]
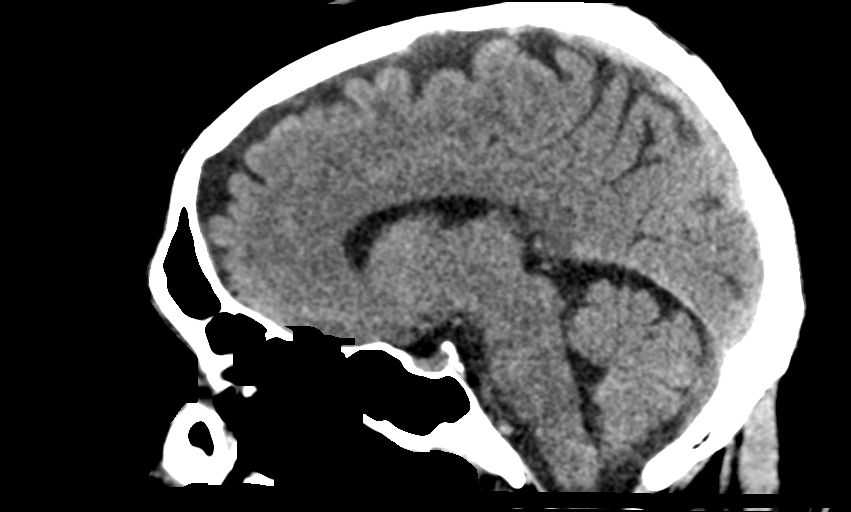
[im 45/67  brain]
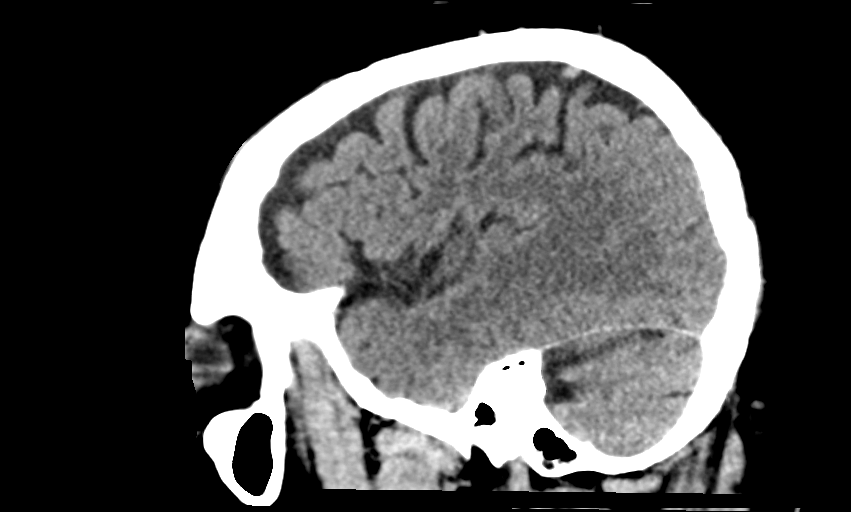

[14 of 47 positions shown; findings below may reference images not displayed]

FINDINGS: Brain: No subdural, epidural, or subarachnoid hemorrhage. Ventricles
are normal. Sulci are mildly prominent consistent volume loss.
Cerebellum, brainstem, and basal cisterns are normal. No mass effect
or midline shift. No acute cortical ischemia or infarct.

Vascular: No hyperdense vessel or unexpected calcification.

Skull: Normal. Negative for fracture or focal lesion.

Sinuses/Orbits: Mucosal thickening and partial opacification of the
left maxillary sinus. Mucous retention cyst in the right maxillary
sinus and left sphenoid sinus. Scattered opacification of ethmoid
air cells and mucosal thickening in the frontal sinuses. No
air-fluid levels. Mastoid air cells and middle ears are well
aerated.

Other: None.
IMPRESSION: 1. Sinus disease as above.
2. No acute intracranial abnormalities.

## 2018-12-10 ENCOUNTER — Ambulatory Visit: Payer: Self-pay | Attending: Family Medicine | Admitting: Family Medicine

## 2018-12-10 ENCOUNTER — Other Ambulatory Visit: Payer: Self-pay

## 2018-12-10 ENCOUNTER — Encounter: Payer: Self-pay | Admitting: Family Medicine

## 2018-12-10 DIAGNOSIS — R7303 Prediabetes: Secondary | ICD-10-CM

## 2018-12-10 DIAGNOSIS — E7800 Pure hypercholesterolemia, unspecified: Secondary | ICD-10-CM

## 2018-12-10 DIAGNOSIS — M48062 Spinal stenosis, lumbar region with neurogenic claudication: Secondary | ICD-10-CM

## 2018-12-10 DIAGNOSIS — I10 Essential (primary) hypertension: Secondary | ICD-10-CM

## 2018-12-10 MED ORDER — LOSARTAN POTASSIUM 50 MG PO TABS: 50.0000 mg | ORAL_TABLET | Freq: Every day | ORAL | 1 refills | Status: DC

## 2018-12-10 MED ORDER — CYCLOBENZAPRINE HCL 10 MG PO TABS: 10.0000 mg | ORAL_TABLET | Freq: Three times a day (TID) | ORAL | 2 refills | Status: DC | PRN

## 2018-12-10 MED ORDER — GABAPENTIN 300 MG PO CAPS: 300.0000 mg | ORAL_CAPSULE | Freq: Two times a day (BID) | ORAL | 3 refills | Status: DC

## 2018-12-10 MED ORDER — DULOXETINE HCL 60 MG PO CPEP: 60.0000 mg | ORAL_CAPSULE | Freq: Every day | ORAL | 3 refills | Status: DC

## 2018-12-10 NOTE — Progress Notes (Signed)
Virtual Visit via Telephone Note  I connected with [place], on 12/10/2018 at 3:10pm by telephone due to the COVID-19 pandemic and verified that I am speaking with the correct person using two identifiers.   Consent: I discussed the limitations, risks, security and privacy concerns of performing an evaluation and management service by telephone and the availability of in person appointments. I also discussed with the patient that there may be a patient responsible charge related to this service. The patient expressed understanding and agreed to proceed.   Location of Patient: Home  Location of Provider: Clinic   Persons participating in Telemedicine visit: Arch A L Woodroe Chen [place]-CMA Dr. 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 His back pain had been stable until recently when he developed an acute exacerbation with radiation of pain down his right leg, causing insomnia.  He currently is not on any medications but was on gabapentin, Robaxin and tramadol in the past.  He has been unable to see neurosurgery due to lack of medical coverage and inability to afford the $200 requested.  He is requesting an epidural spinal injection. Denies recent falls, loss of sphincteric function, numbness and tingling. He is requesting refill of his antihypertensive and denies adverse effects from his medications; tolerating his statin with no myalgia and reflux symptoms are currently controlled.   Past Medical History:  Diagnosis Date  . Hypertension    No Known Allergies  Current Outpatient Medications on File Prior to Visit  Medication Sig Dispense Refill  . cetirizine (ZYRTEC) 10 MG tablet Take 1 tablet (10 mg total) by mouth daily. 30 tablet 1  . fluticasone (FLONASE) 50  MCG/ACT nasal spray Place 2 sprays into both nostrils daily. 16 g 6  . losartan (COZAAR) 50 MG tablet Take 1 tablet (50 mg total) by mouth daily. Must keep appt 5/18 for more refills. 30 tablet 0  . metoprolol tartrate (LOPRESSOR) 25 MG tablet TAKE 1 TABLET (25 MG TOTAL) BY MOUTH 2 (TWO) TIMES DAILY. MUST MAKE APPT FOR FURTHER REFILLS 60 tablet 2  . amoxicillin (AMOXIL) 500 MG capsule Take 1 capsule (500 mg total) by mouth 3 (three) times daily. (Patient not taking: Reported on 12/10/2018) 30 capsule 0  . atorvastatin (LIPITOR) 20 MG tablet Take 1 tablet (20 mg total) by mouth daily. (Patient not taking: Reported on 09/06/2018) 30 tablet 6   No current facility-administered medications on file prior to visit.     CMP Latest Ref Rng & Units 01/28/2018 01/28/2018 01/08/2018  Glucose 70 - 99 mg/dL 134(H) 140(H) 95  BUN 6 - 20 mg/dL 19 16 18   Creatinine 0.61 - 1.24 mg/dL 1.20 1.17 0.90  Sodium 135 - 145 mmol/L 136 136 140  Potassium 3.5 - 5.1 mmol/L 3.9 4.0 4.7  Chloride 98 - 111 mmol/L 99 102 101  CO2 22 - 32 mmol/L - 26 25  Calcium 8.9 - 10.3 mg/dL - 8.9 9.0  Total Protein 6.5 - 8.1 g/dL - 6.2(L) 6.3  Total Bilirubin 0.3 - 1.2 mg/dL - 0.7 0.5  Alkaline Phos 38 - 126 U/L - 44 59  AST 15 - 41 U/L - 22 20  ALT 0 - 44 U/L - 18 18    Lipid Panel     Component Value Date/Time   CHOL  158 01/08/2018 1613   TRIG 64 01/08/2018 1613   HDL 72 01/08/2018 1613   CHOLHDL 2.2 01/08/2018 1613   CHOLHDL 2.0 05/18/2016 1511   VLDL 16 05/18/2016 1511   LDLCALC 73 01/08/2018 1613    Lab Results  Component Value Date   HGBA1C 5.60 09/15/2015     Observations/Objective: Alert, awake, oriented x3 Not in acute distress  Assessment and Plan: 1. Essential hypertension Continue antihypertensive Low-sodium, DASH diet - losartan (COZAAR) 50 MG tablet; Take 1 tablet (50 mg total) by mouth daily.  Dispense: 90 tablet; Refill: 1 - CMP14+EGFR; Future  2. Spinal stenosis of lumbar region with neurogenic  claudication Uncontrolled Commence gabapentin, Cymbalta and Flexeril - gabapentin (NEURONTIN) 300 MG capsule; Take 1 capsule (300 mg total) by mouth 2 (two) times daily.  Dispense: 60 capsule; Refill: 3 - DULoxetine (CYMBALTA) 60 MG capsule; Take 1 capsule (60 mg total) by mouth daily.  Dispense: 30 capsule; Refill: 3 - cyclobenzaprine (FLEXERIL) 10 MG tablet; Take 1 tablet (10 mg total) by mouth 3 (three) times daily as needed for muscle spasms.  Dispense: 60 tablet; Refill: 2 - Ambulatory referral to Physical Therapy - Ambulatory referral to Physical Medicine Rehab  3. Pure hypercholesterolemia Continue statin Low-cholesterol diet - Lipid panel; Future  4. Prediabetes Last A1c was 5.6 in 2017 - Hemoglobin A1c; Future   Follow Up Instructions: Return in about 3 months (around 03/12/2019).    I discussed the assessment and treatment plan with the patient. The patient was provided an opportunity to ask questions and all were answered. The patient agreed with the plan and demonstrated an understanding of the instructions.   The patient was advised to call back or seek an in-person evaluation if the symptoms worsen or if the condition fails to improve as anticipated.     I provided 20 minutes total of non-face-to-face time during this encounter including median intraservice time, reviewing previous notes, labs, imaging, medications and explaining diagnosis and management.     Charlott Rakes, MD, FAAFP. [facility] and [facility] [place], [place]   12/10/2018, 3:28 PM

## 2018-12-10 NOTE — Progress Notes (Signed)
Patient has been called and DOB has been verified. Patient has been screened and transferred to PCP to start phone visit.     

## 2018-12-18 ENCOUNTER — Other Ambulatory Visit: Payer: Self-pay

## 2018-12-19 ENCOUNTER — Encounter: Payer: Self-pay | Admitting: Family Medicine

## 2018-12-19 ENCOUNTER — Other Ambulatory Visit: Payer: Self-pay

## 2018-12-19 ENCOUNTER — Ambulatory Visit: Payer: Self-pay | Attending: Family Medicine | Admitting: Family Medicine

## 2018-12-19 DIAGNOSIS — R600 Localized edema: Secondary | ICD-10-CM

## 2018-12-19 DIAGNOSIS — M48062 Spinal stenosis, lumbar region with neurogenic claudication: Secondary | ICD-10-CM

## 2018-12-19 MED ORDER — ACETAMINOPHEN-CODEINE #3 300-30 MG PO TABS: 1.0000 | ORAL_TABLET | Freq: Every day | ORAL | 0 refills | Status: DC

## 2018-12-19 NOTE — Progress Notes (Signed)
Virtual Visit via Telephone Note  I connected with Timothy Sanchez, on 12/19/2018 at 11:25 AM by telephone due to the COVID-19 pandemic and verified that I am speaking with the correct person using two identifiers.   Consent: I discussed the limitations, risks, security and privacy concerns of performing an evaluation and management service by telephone and the availability of in person appointments. I also discussed with the patient that there may be a patient responsible charge related to this service. The patient expressed understanding and agreed to proceed.   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 His back pain is severe causing him to sleep in a recliner and associated with radiation of pain down his right leg, causing insomnia.  Last week I had placed him on Cymbalta, Flexeril and gabapentin which he states have been ineffective.  He has been unable to go to work and he works as a Geophysicist/field seismologist at [employer].  He is requesting a letter to work to this effect. He also noticed pedal edema but denies shortness of breath or weight gain. I had referred him to physical therapy and rehab medicine at his last visit.   Past Medical History:  Diagnosis Date  . Hypertension    No Known Allergies  Current Outpatient Medications on File Prior to Visit  Medication Sig Dispense Refill  . cetirizine (ZYRTEC) 10 MG tablet Take 1 tablet (10 mg total) by mouth daily. 30 tablet 1  . cyclobenzaprine (FLEXERIL) 10 MG tablet Take 1 tablet (10 mg total) by mouth 3 (three) times daily as needed for muscle spasms. 60 tablet 2  . DULoxetine (CYMBALTA) 60 MG  capsule Take 1 capsule (60 mg total) by mouth daily. 30 capsule 3  . fluticasone (FLONASE) 50 MCG/ACT nasal spray Place 2 sprays into both nostrils daily. 16 g 6  . gabapentin (NEURONTIN) 300 MG capsule Take 1 capsule (300 mg total) by mouth 2 (two) times daily. 60 capsule 3  . losartan (COZAAR) 50 MG tablet Take 1 tablet (50 mg total) by mouth daily. 90 tablet 1  . metoprolol tartrate (LOPRESSOR) 25 MG tablet TAKE 1 TABLET (25 MG TOTAL) BY MOUTH 2 (TWO) TIMES DAILY. MUST MAKE APPT FOR FURTHER REFILLS 60 tablet 2  . amoxicillin (AMOXIL) 500 MG capsule Take 1 capsule (500 mg total) by mouth 3 (three) times daily. (Patient not taking: Reported on 12/10/2018) 30 capsule 0  . atorvastatin (LIPITOR) 20 MG tablet Take 1 tablet (20 mg total) by mouth daily. (Patient not taking: Reported on 09/06/2018) 30 tablet 6   No current facility-administered medications on file prior to visit.     Observations/Objective: Alert, awake, oriented x3 Not in acute distress   Assessment and Plan: 1. Spinal stenosis of lumbar region with neurogenic claudication Uncontrolled Awaiting PT and Rehab Med appt Continue Cymbalta, Flexeril and gabapentin Tylenol 3 added to regimen - acetaminophen-codeine (TYLENOL #3) 300-30 MG tablet; Take 1 tablet by mouth at bedtime.  Dispense: 30 tablet; Refill: 0  2. Pedal edema Likely dependent edema Elevate feet   Follow Up Instructions: Keep previously scheduled appointment   I discussed the assessment and treatment plan with  the patient. The patient was provided an opportunity to ask questions and all were answered. The patient agreed with the plan and demonstrated an understanding of the instructions.   The patient was advised to call back or seek an in-person evaluation if the symptoms worsen or if the condition fails to improve as anticipated.     I provided 11 minutes total of non-face-to-face time during this encounter including median intraservice time, reviewing  previous notes, labs, imaging, medications, management and patient verbalized understanding.     Charlott Rakes, MD, FAAFP. [facility] and [place], [place]   12/19/2018, 11:25 AM

## 2018-12-31 ENCOUNTER — Other Ambulatory Visit: Payer: Self-pay | Admitting: Family Medicine

## 2018-12-31 DIAGNOSIS — I10 Essential (primary) hypertension: Secondary | ICD-10-CM

## 2019-01-02 MED FILL — LOSARTAN POTASSIUM 50 MG TA: 50 | 30 days supply | Qty: 30 | Fill #0

## 2019-03-28 MED FILL — METOPROLOL TARTRATE 25 MG T: 25 | 30 days supply | Qty: 60 | Fill #2

## 2019-03-28 MED FILL — LOSARTAN POTASSIUM 50 MG TA: 50 | 30 days supply | Qty: 30 | Fill #0

## 2019-04-22 ENCOUNTER — Other Ambulatory Visit: Payer: Self-pay | Admitting: Family Medicine

## 2019-04-22 DIAGNOSIS — I10 Essential (primary) hypertension: Secondary | ICD-10-CM

## 2019-04-22 MED FILL — LOSARTAN POTASSIUM 50 MG TA: 50 | 30 days supply | Qty: 30 | Fill #1

## 2019-04-24 ENCOUNTER — Other Ambulatory Visit: Payer: Self-pay

## 2019-04-24 ENCOUNTER — Ambulatory Visit: Payer: Self-pay | Attending: Family Medicine | Admitting: Physician Assistant

## 2019-04-24 DIAGNOSIS — K219 Gastro-esophageal reflux disease without esophagitis: Secondary | ICD-10-CM

## 2019-04-24 MED ORDER — OMEPRAZOLE 20 MG PO CPDR: 20.0000 mg | DELAYED_RELEASE_CAPSULE | Freq: Every day | ORAL | 3 refills | Status: DC

## 2019-04-24 MED FILL — OMEPRAZOLE 20 MG CAP: 20 | 30 days supply | Qty: 30 | Fill #0

## 2019-04-24 NOTE — Progress Notes (Signed)
Virtual Visit via Telephone Note  I connected with [name] on 04/24/19 at  1:30 PM EDT by telephone and verified that I am speaking with the correct person using two identifiers.   I discussed the limitations, risks, security and privacy concerns of performing an evaluation and management service by telephone and the availability of in person appointments. I also discussed with the patient that there may be a patient responsible charge related to this service. The patient expressed understanding and agreed to proceed.  Patient location:  home [name]:  home office Persons on the call:  Me and the patient   History of Present Illness:  Patient needs to get back on acid reflux medications.  He hasn't taken anything in several months and feels more gassy.  More bloated.  Sometimes constipated and sometimes normal BM. Appetite is good.  No melena or hematochezia.  He has never had colonoscopy.  Does not have abdominal pain or fever.    Observations/Objective:  NAD.  A&Ox3   Assessment and Plan: 1. Gastroesophageal reflux disease without esophagitis - omeprazole (PRILOSEC) 20 MG capsule; Take 1 capsule (20 mg total) by mouth daily.  Dispense: 30 capsule; Refill: 3 - Ambulatory referral to Gastroenterology   Follow Up Instructions: 6 weeks with PCP   I discussed the assessment and treatment plan with the patient. The patient was provided an opportunity to ask questions and all were answered. The patient agreed with the plan and demonstrated an understanding of the instructions.   The patient was advised to call back or seek an in-person evaluation if the symptoms worsen or if the condition fails to improve as anticipated.  I provided 9 minutes of non-face-to-face time during this encounter.   Timothy Caldron, PA-C  Patient ID: Timothy Sanchez, male   DOB: Sep 19, 1965, 53 y.o.   MRN: [id number]

## 2019-05-27 MED FILL — LOSARTAN POTASSIUM 50 MG TA: 50 | 30 days supply | Qty: 30 | Fill #2

## 2019-05-27 MED FILL — OMEPRAZOLE 20 MG CAP: 20 | 30 days supply | Qty: 30 | Fill #1

## 2019-05-27 MED FILL — METOPROLOL TARTRATE 25 MG T: 25 | 30 days supply | Qty: 60 | Fill #1

## 2019-06-24 ENCOUNTER — Other Ambulatory Visit: Payer: Self-pay | Admitting: Family Medicine

## 2019-06-24 DIAGNOSIS — I10 Essential (primary) hypertension: Secondary | ICD-10-CM

## 2019-06-24 MED FILL — OMEPRAZOLE 20 MG CAP: 20 | 30 days supply | Qty: 30 | Fill #2

## 2019-06-24 NOTE — Telephone Encounter (Signed)
Please fill if appropriate. Patient last labs ordered in MAY and were not completed. Patient had tele visit in September with no follow up made for 1 MTH with PCP.

## 2019-06-27 MED FILL — LOSARTAN POTASSIUM 50 MG TA: 50 | 30 days supply | Qty: 30 | Fill #0

## 2019-06-27 MED FILL — METOPROLOL TARTRATE 25 MG T: 25 | 30 days supply | Qty: 60 | Fill #2

## 2019-06-27 NOTE — Telephone Encounter (Signed)
30-day supply given.  Please schedule for labs and follow-up as he is overdue.  Thank you

## 2019-06-27 NOTE — Telephone Encounter (Signed)
Please schedule according to PCP's preference.

## 2019-07-23 ENCOUNTER — Encounter: Payer: Self-pay | Admitting: Family Medicine

## 2019-07-23 ENCOUNTER — Other Ambulatory Visit: Payer: Self-pay

## 2019-07-23 ENCOUNTER — Ambulatory Visit: Payer: Self-pay | Attending: Family Medicine | Admitting: Family Medicine

## 2019-07-23 DIAGNOSIS — E785 Hyperlipidemia, unspecified: Secondary | ICD-10-CM

## 2019-07-23 DIAGNOSIS — I10 Essential (primary) hypertension: Secondary | ICD-10-CM

## 2019-07-23 DIAGNOSIS — K219 Gastro-esophageal reflux disease without esophagitis: Secondary | ICD-10-CM

## 2019-07-23 DIAGNOSIS — K5909 Other constipation: Secondary | ICD-10-CM

## 2019-07-23 DIAGNOSIS — J329 Chronic sinusitis, unspecified: Secondary | ICD-10-CM

## 2019-07-23 MED ORDER — ATORVASTATIN CALCIUM 20 MG PO TABS: 20.0000 mg | ORAL_TABLET | Freq: Every day | ORAL | 6 refills | Status: DC

## 2019-07-23 MED ORDER — POLYETHYLENE GLYCOL 3350 17 GM/SCOOP PO POWD: 17.0000 g | Freq: Once | ORAL | 1 refills | Status: AC

## 2019-07-23 MED ORDER — OMEPRAZOLE 20 MG PO CPDR: 20.0000 mg | DELAYED_RELEASE_CAPSULE | Freq: Every day | ORAL | 6 refills | Status: DC

## 2019-07-23 MED ORDER — LOSARTAN POTASSIUM 50 MG PO TABS: 50.0000 mg | ORAL_TABLET | Freq: Every day | ORAL | 6 refills | Status: DC

## 2019-07-23 MED ORDER — AMOXICILLIN 500 MG PO CAPS: 500.0000 mg | ORAL_CAPSULE | Freq: Three times a day (TID) | ORAL | 0 refills | Status: DC

## 2019-07-23 MED ORDER — METOPROLOL TARTRATE 25 MG PO TABS: 25.0000 mg | ORAL_TABLET | Freq: Two times a day (BID) | ORAL | 6 refills | Status: DC

## 2019-07-23 MED FILL — POLYETHYLENE GLYCOL 3350 PO: 17 | 15 days supply | Qty: 238 | Fill #0

## 2019-07-23 MED FILL — ?METOPROLOL 25 MG TABLET: 25 | 30 days supply | Qty: 60 | Fill #2

## 2019-07-23 MED FILL — ?AMOXICILLIN 500 MG CAPS: 500 | 10 days supply | Qty: 30 | Fill #0

## 2019-07-23 MED FILL — ?OMEPRAZOLE 20MG CAP DR: 20 | 30 days supply | Qty: 30 | Fill #3

## 2019-07-23 MED FILL — ?ATORVASTATIN 20 MG TABLET: 20 | 30 days supply | Qty: 30 | Fill #0

## 2019-07-23 MED FILL — LOSARTAN POTASSIUM 50 MG TA: 50 | 30 days supply | Qty: 30 | Fill #0

## 2019-07-23 NOTE — Progress Notes (Signed)
Virtual Visit via Telephone Note  I connected with Timothy Sanchez, on 07/23/2019 at 2:26 PM by telephone due to the COVID-19 pandemic and verified that I am speaking with the correct person using two identifiers.   Consent: I discussed the limitations, risks, security and privacy concerns of performing an evaluation and management service by telephone and the availability of in person appointments. I also discussed with the patient that there may be a patient responsible charge related to this service. The patient expressed understanding and agreed to proceed.   Location of Patient: Home  Location of Provider: Clinic   Persons participating in Telemedicine visit: Kemuel A L Woodroe Chen Farrington-CMA Dr. 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   He complains of a "bad sinus infection" which he described as pressure in his forehead, headaches, nasal congestion for 3-4 weeks. Denies the presence of myalgias or fever. He has internal hemorrhoids but denies rectal bleed; he is constipated even though he moves his bowel daily.  With regards to his spinal stenosis his back has been stable and he does not need medications. Compliant with his antihypertensive and his statin.  He is due for blood work and this was ordered in 11/2018 however he never came to the clinic to have this done.  Past Medical History:  Diagnosis Date  . Hypertension    No Known Allergies  Current Outpatient Medications on File Prior to Visit  Medication Sig Dispense Refill  . atorvastatin (LIPITOR) 20 MG tablet Take 1 tablet (20 mg total) by mouth daily. 30 tablet 6  . cetirizine (ZYRTEC) 10 MG tablet Take 1 tablet (10 mg total) by mouth daily. 30 tablet 1  . DULoxetine (CYMBALTA) 60 MG capsule Take 1 capsule (60  mg total) by mouth daily. 30 capsule 3  . fluticasone (FLONASE) 50 MCG/ACT nasal spray Place 2 sprays into both nostrils daily. 16 g 6  . losartan (COZAAR) 50 MG tablet TAKE 1 TABLET (50 MG TOTAL) BY MOUTH DAILY. 30 tablet 0  . metoprolol tartrate (LOPRESSOR) 25 MG tablet TAKE 1 TABLET (25 MG TOTAL) BY MOUTH 2 (TWO) TIMES DAILY. MUST MAKE APPT FOR FURTHER REFILLS 60 tablet 2  . omeprazole (PRILOSEC) 20 MG capsule Take 1 capsule (20 mg total) by mouth daily. 30 capsule 3  . acetaminophen-codeine (TYLENOL #3) 300-30 MG tablet Take 1 tablet by mouth at bedtime. (Patient not taking: Reported on 07/23/2019) 30 tablet 0  . amoxicillin (AMOXIL) 500 MG capsule Take 1 capsule (500 mg total) by mouth 3 (three) times daily. (Patient not taking: Reported on 12/10/2018) 30 capsule 0  . cyclobenzaprine (FLEXERIL) 10 MG tablet Take 1 tablet (10 mg total) by mouth 3 (three) times daily as needed for muscle spasms. (Patient not taking: Reported on 07/23/2019) 60 tablet 2  . gabapentin (NEURONTIN) 300 MG capsule Take 1 capsule (300 mg total) by mouth 2 (two) times daily. (Patient not taking: Reported on 07/23/2019) 60 capsule 3   No current facility-administered medications on file prior to visit.    Observations/Objective: Awake, alert, oriented x3 Not in acute distress  CMP Latest Ref Rng & Units 01/28/2018 01/28/2018 01/08/2018  Glucose 70 - 99 mg/dL 134(H) 140(H) 95  BUN 6 - 20 mg/dL 19 16 18   Creatinine 0.61 - 1.24 mg/dL 1.20 1.17 0.90  Sodium 135 - 145 mmol/L 136 136 140  Potassium 3.5 - 5.1 mmol/L 3.9 4.0 4.7  Chloride 98 - 111 mmol/L 99 102 101  CO2 22 - 32 mmol/L - 26 25  Calcium 8.9 - 10.3 mg/dL - 8.9 9.0  Total Protein 6.5 - 8.1 g/dL - 6.2(L) 6.3  Total Bilirubin 0.3 - 1.2 mg/dL - 0.7 0.5  Alkaline Phos 38 - 126 U/L - 44 59  AST 15 - 41 U/L - 22 20  ALT 0 - 44 U/L - 18 18    Lipid Panel     Component Value Date/Time   CHOL 158 01/08/2018 1613   TRIG 64 01/08/2018 1613   HDL 72 01/08/2018  1613   CHOLHDL 2.2 01/08/2018 1613   CHOLHDL 2.0 05/18/2016 1511   VLDL 16 05/18/2016 1511   LDLCALC 73 01/08/2018 1613   LABVLDL 13 01/08/2018 1613    Assessment and Plan: 1. Sinusitis, unspecified chronicity, unspecified location Symptoms have been present for over 3 weeks We will place on antibiotics - amoxicillin (AMOXIL) 500 MG capsule; Take 1 capsule (500 mg total) by mouth 3 (three) times daily.  Dispense: 30 capsule; Refill: 0  2. Gastroesophageal reflux disease without esophagitis Stable - omeprazole (PRILOSEC) 20 MG capsule; Take 1 capsule (20 mg total) by mouth daily.  Dispense: 30 capsule; Refill: 6  3. Essential hypertension Stable Counseled on blood pressure goal of less than 130/80, low-sodium, DASH diet, medication compliance, 150 minutes of moderate intensity exercise per week. Discussed medication compliance, adverse effects. Due for labs and has been advised to come into the office for this - metoprolol tartrate (LOPRESSOR) 25 MG tablet; Take 1 tablet (25 mg total) by mouth 2 (two) times daily.  Dispense: 60 tablet; Refill: 6 - losartan (COZAAR) 50 MG tablet; Take 1 tablet (50 mg total) by mouth daily.  Dispense: 30 tablet; Refill: 6 - Complete Metabolic Panel with GFR; Future - Lipid panel; Future  4. Hyperlipidemia, unspecified hyperlipidemia type Controlled Low-cholesterol diet - atorvastatin (LIPITOR) 20 MG tablet; Take 1 tablet (20 mg total) by mouth daily.  Dispense: 30 tablet; Refill: 6  5. Other constipation Counseled on increasing fiber intake, water as constipation predisposes to hemorrhoids We will commence MiraLAX If rectal bleeding or pain worsens will consider treating hemorrhoid - polyethylene glycol powder (GLYCOLAX/MIRALAX) 17 GM/SCOOP powder; Take 17 g by mouth once for 1 dose.  Dispense: 3350 g; Refill: 1   Follow Up Instructions: Return in about 6 months (around 01/21/2020).    I discussed the assessment and treatment plan with the  patient. The patient was provided an opportunity to ask questions and all were answered. The patient agreed with the plan and demonstrated an understanding of the instructions.   The patient was advised to call back or seek an in-person evaluation if the symptoms worsen or if the condition fails to improve as anticipated.     I provided 18 minutes total of non-face-to-face time during this encounter including median intraservice time, reviewing previous notes, labs, imaging, medications, management and patient verbalized understanding.     Charlott Rakes, MD, FAAFP. [facility] and [place] [facility], [place]   07/23/2019, 2:26 PM

## 2019-07-23 NOTE — Progress Notes (Signed)
Patient has been called and DOB has been verified. Patient has been screened and transferred to PCP to start phone visit.     

## 2019-07-30 ENCOUNTER — Ambulatory Visit: Payer: Self-pay | Attending: Family Medicine

## 2019-07-30 ENCOUNTER — Other Ambulatory Visit: Payer: Self-pay

## 2019-07-30 DIAGNOSIS — I10 Essential (primary) hypertension: Secondary | ICD-10-CM

## 2019-07-31 ENCOUNTER — Other Ambulatory Visit: Payer: Self-pay | Admitting: Family Medicine

## 2019-07-31 DIAGNOSIS — E785 Hyperlipidemia, unspecified: Secondary | ICD-10-CM

## 2019-07-31 LAB — CMP14+EGFR
ALT: 19 IU/L (ref 0–44)
AST: 23 IU/L (ref 0–40)
Albumin/Globulin Ratio: 1.7 (ref 1.2–2.2)
Albumin: 4 g/dL (ref 3.8–4.9)
Alkaline Phosphatase: 59 IU/L (ref 39–117)
BUN/Creatinine Ratio: 18 (ref 9–20)
BUN: 14 mg/dL (ref 6–24)
Bilirubin Total: 0.3 mg/dL (ref 0.0–1.2)
CO2: 24 mmol/L (ref 20–29)
Calcium: 9.1 mg/dL (ref 8.7–10.2)
Chloride: 99 mmol/L (ref 96–106)
Creatinine, Ser: 0.77 mg/dL (ref 0.76–1.27)
GFR calc Af Amer: 121 mL/min/{1.73_m2} (ref >59)
GFR calc non Af Amer: 104 mL/min/{1.73_m2} (ref >59)
Globulin, Total: 2.4 g/dL (ref 1.5–4.5)
Glucose: 79 mg/dL (ref 65–99)
Potassium: 4.4 mmol/L (ref 3.5–5.2)
Sodium: 140 mmol/L (ref 134–144)
Total Protein: 6.4 g/dL (ref 6.0–8.5)

## 2019-07-31 LAB — LIPID PANEL
Chol/HDL Ratio: 3.2 ratio (ref 0.0–5.0)
Cholesterol, Total: 223 mg/dL — ABNORMAL HIGH (ref 100–199)
HDL: 70 mg/dL (ref >39)
LDL Chol Calc (NIH): 114 mg/dL — ABNORMAL HIGH (ref 0–99)
Triglycerides: 228 mg/dL — ABNORMAL HIGH (ref 0–149)
VLDL Cholesterol Cal: 39 mg/dL (ref 5–40)

## 2019-07-31 MED ORDER — ATORVASTATIN CALCIUM 40 MG PO TABS: 40.0000 mg | ORAL_TABLET | Freq: Every day | ORAL | 6 refills | Status: DC

## 2019-08-02 ENCOUNTER — Telehealth: Payer: Self-pay

## 2019-08-02 NOTE — Telephone Encounter (Signed)
-----   Message from Charlott Rakes, MD sent at 07/31/2019 12:36 PM EST ----- Cholesterol is elevated, I have increased Lipitor to 40 mg and he would need to adhere to a low-cholesterol diet.

## 2019-08-02 NOTE — Telephone Encounter (Signed)
Patient name and DOB has been verified Patient was informed of lab results. Patient had no questions.  

## 2019-08-06 ENCOUNTER — Telehealth: Payer: Self-pay

## 2019-08-06 DIAGNOSIS — E785 Hyperlipidemia, unspecified: Secondary | ICD-10-CM

## 2019-08-06 MED ORDER — ATORVASTATIN CALCIUM 40 MG PO TABS: 40.0000 mg | ORAL_TABLET | Freq: Every day | ORAL | 6 refills | Status: DC

## 2019-08-07 MED FILL — ?ATORVASTATIN 40MG TABLET: 40 | 30 days supply | Qty: 30 | Fill #0

## 2019-08-07 NOTE — Telephone Encounter (Signed)
Patient came in to facility stating the medication that was prescribed for his hemorrhoids Miralax is not helping him. Please follow up with patient.

## 2019-08-08 MED ORDER — HYDROCORTISONE ACE-PRAMOXINE 1-1 % EX CREA: 1.0000 "application " | TOPICAL_CREAM | Freq: Two times a day (BID) | CUTANEOUS | 3 refills | Status: DC

## 2019-08-08 MED FILL — HYDROCORT-PRAMOXINE 1%-1% C: 1-1 | 15 days supply | Qty: 30 | Fill #0

## 2019-08-08 NOTE — Telephone Encounter (Signed)
Patient was called and informed of medication being sent to pharmacy. 

## 2019-08-08 NOTE — Telephone Encounter (Signed)
Patient was given Miralax for constipation and he is requesting medication for hemorrhoids.

## 2019-08-08 NOTE — Telephone Encounter (Signed)
Done

## 2019-08-26 MED FILL — LOSARTAN POTASSIUM 50 MG TA: 50 | 30 days supply | Qty: 30 | Fill #1

## 2019-09-02 ENCOUNTER — Encounter: Payer: Self-pay | Admitting: Family Medicine

## 2019-09-02 ENCOUNTER — Ambulatory Visit: Payer: Self-pay | Attending: Family Medicine | Admitting: Family Medicine

## 2019-09-02 ENCOUNTER — Other Ambulatory Visit: Payer: Self-pay

## 2019-09-02 DIAGNOSIS — R358 Other polyuria: Secondary | ICD-10-CM

## 2019-09-02 DIAGNOSIS — R3589 Other polyuria: Secondary | ICD-10-CM

## 2019-09-02 DIAGNOSIS — R350 Frequency of micturition: Secondary | ICD-10-CM

## 2019-09-02 DIAGNOSIS — Z1211 Encounter for screening for malignant neoplasm of colon: Secondary | ICD-10-CM

## 2019-09-02 DIAGNOSIS — Z1159 Encounter for screening for other viral diseases: Secondary | ICD-10-CM

## 2019-09-02 NOTE — Progress Notes (Signed)
Patient has been called and DOB has been verified. Patient has been screened and transferred to PCP to start phone visit.     

## 2019-09-02 NOTE — Progress Notes (Signed)
Virtual Visit via Telephone Note  I connected with [place], on 09/02/2019 at 2:57 PM by telephone due to the COVID-19 pandemic and verified that I am speaking with the correct person using two identifiers.   Consent: I discussed the limitations, risks, security and privacy concerns of performing an evaluation and management service by telephone and the availability of in person appointments. I also discussed with the patient that there may be a patient responsible charge related to this service. The patient expressed understanding and agreed to proceed.   Location of Patient: Home  Location of Provider: Clinic   Persons participating in Telemedicine visit: Kaisen A L Woodroe Chen Farrington-CMA Dr. 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   He would like to be screened for Diabetes as he has had polyuria, polydipsia for the last 2 weeks.Last set of labs from last month revealed a normal glucose of 79.He also  Sometimes has intermittent diarrhea and constipation and would like his "colon checked." He had a visit for chronic disease management on 07/23/19.  Past Medical History:  Diagnosis Date  . Hypertension    No Known Allergies  Current Outpatient Medications on File Prior to Visit  Medication Sig Dispense Refill  . atorvastatin (LIPITOR) 40 MG tablet Take 1 tablet (40 mg total) by mouth daily. 30 tablet 6  . cetirizine (ZYRTEC) 10 MG tablet Take 1 tablet (10 mg total) by mouth daily. 30 tablet 1  . DULoxetine (CYMBALTA) 60 MG capsule Take 1 capsule (60 mg total) by mouth daily. 30 capsule 3  . fluticasone (FLONASE) 50 MCG/ACT nasal spray Place 2 sprays into both nostrils daily. 16 g 6  . losartan (COZAAR) 50 MG tablet Take 1 tablet (50 mg total) by mouth daily. 30 tablet 6   . metoprolol tartrate (LOPRESSOR) 25 MG tablet Take 1 tablet (25 mg total) by mouth 2 (two) times daily. 60 tablet 6  . omeprazole (PRILOSEC) 20 MG capsule Take 1 capsule (20 mg total) by mouth daily. 30 capsule 6  . pramoxine-hydrocortisone (ANALPRAM-HC) 1-1 % rectal cream Place 1 application rectally 2 (two) times daily. 30 g 3  . acetaminophen-codeine (TYLENOL #3) 300-30 MG tablet Take 1 tablet by mouth at bedtime. (Patient not taking: Reported on 07/23/2019) 30 tablet 0  . amoxicillin (AMOXIL) 500 MG capsule Take 1 capsule (500 mg total) by mouth 3 (three) times daily. (Patient not taking: Reported on 09/02/2019) 30 capsule 0  . cyclobenzaprine (FLEXERIL) 10 MG tablet Take 1 tablet (10 mg total) by mouth 3 (three) times daily as needed for muscle spasms. (Patient not taking: Reported on 07/23/2019) 60 tablet 2  . gabapentin (NEURONTIN) 300 MG capsule Take 1 capsule (300 mg total) by mouth 2 (two) times daily. (Patient not taking: Reported on 07/23/2019) 60 capsule 3   No current facility-administered medications on file prior to visit.    Observations/Objective: Alert, awake not in acute distress Not in acute distress  Assessment and Plan: 1. Polyuria Last glucose was normal Will screen for DM - Hemoglobin A1c; Future - PSA, total and free; Future  2. Urinary frequency Advised of differentials including, DM, BPH If A1c is negative consider commencing Flomax. We have discussed this and he is agreeable. - PSA, total and free; Future  3. Screening for colon cancer Would  love to refer for Colonoscopy however he has no medical insurance Advised to apply for the [employer] discount to facilitate this Meanwhile FIT test ordered - Fecal occult blood, imunochemical([facility]); Future  4. Screening for viral disease - HIV antibody (with reflex); Future   Follow Up Instructions: Keep previously scheduled appt   I discussed the assessment and treatment plan with the  patient. The patient was provided an opportunity to ask questions and all were answered. The patient agreed with the plan and demonstrated an understanding of the instructions.   The patient was advised to call back or seek an in-person evaluation if the symptoms worsen or if the condition fails to improve as anticipated.     I provided 13 minutes total of non-face-to-face time during this encounter including median intraservice time, reviewing previous notes, investigations, ordering medications, medical decision making, coordinating care and patient verbalized understanding at the end of the visit.     Charlott Rakes, MD, FAAFP. [facility] and [place] [employer], [place]   09/02/2019, 2:57 PM

## 2019-09-03 ENCOUNTER — Other Ambulatory Visit: Payer: Self-pay

## 2019-09-03 ENCOUNTER — Ambulatory Visit: Payer: Self-pay | Attending: Family Medicine

## 2019-09-03 DIAGNOSIS — Z1211 Encounter for screening for malignant neoplasm of colon: Secondary | ICD-10-CM

## 2019-09-03 DIAGNOSIS — Z1159 Encounter for screening for other viral diseases: Secondary | ICD-10-CM

## 2019-09-03 DIAGNOSIS — R358 Other polyuria: Secondary | ICD-10-CM

## 2019-09-03 DIAGNOSIS — E7800 Pure hypercholesterolemia, unspecified: Secondary | ICD-10-CM

## 2019-09-03 DIAGNOSIS — R3589 Other polyuria: Secondary | ICD-10-CM

## 2019-09-03 DIAGNOSIS — R350 Frequency of micturition: Secondary | ICD-10-CM

## 2019-09-03 DIAGNOSIS — R7303 Prediabetes: Secondary | ICD-10-CM

## 2019-09-03 DIAGNOSIS — I10 Essential (primary) hypertension: Secondary | ICD-10-CM

## 2019-09-04 LAB — HEMOGLOBIN A1C
Est. average glucose Bld gHb Est-mCnc: 117 mg/dL
Hgb A1c MFr Bld: 5.7 % — ABNORMAL HIGH (ref 4.8–5.6)

## 2019-09-04 LAB — CMP14+EGFR
ALT: 32 IU/L (ref 0–44)
AST: 34 IU/L (ref 0–40)
Albumin/Globulin Ratio: 1.9 (ref 1.2–2.2)
Albumin: 4.3 g/dL (ref 3.8–4.9)
Alkaline Phosphatase: 71 IU/L (ref 39–117)
BUN/Creatinine Ratio: 19 (ref 9–20)
BUN: 17 mg/dL (ref 6–24)
Bilirubin Total: 0.5 mg/dL (ref 0.0–1.2)
CO2: 25 mmol/L (ref 20–29)
Calcium: 9.4 mg/dL (ref 8.7–10.2)
Chloride: 99 mmol/L (ref 96–106)
Creatinine, Ser: 0.91 mg/dL (ref 0.76–1.27)
GFR calc Af Amer: 112 mL/min/{1.73_m2} (ref >59)
GFR calc non Af Amer: 97 mL/min/{1.73_m2} (ref >59)
Globulin, Total: 2.3 g/dL (ref 1.5–4.5)
Glucose: 101 mg/dL — ABNORMAL HIGH (ref 65–99)
Potassium: 4.2 mmol/L (ref 3.5–5.2)
Sodium: 139 mmol/L (ref 134–144)
Total Protein: 6.6 g/dL (ref 6.0–8.5)

## 2019-09-04 LAB — PSA, TOTAL AND FREE
PSA, Free Pct: 45 %
PSA, Free: 0.18 ng/mL
Prostate Specific Ag, Serum: 0.4 ng/mL (ref 0.0–4.0)

## 2019-09-04 LAB — LIPID PANEL
Chol/HDL Ratio: 2.1 ratio (ref 0.0–5.0)
Cholesterol, Total: 157 mg/dL (ref 100–199)
HDL: 75 mg/dL (ref >39)
LDL Chol Calc (NIH): 60 mg/dL (ref 0–99)
Triglycerides: 132 mg/dL (ref 0–149)
VLDL Cholesterol Cal: 22 mg/dL (ref 5–40)

## 2019-09-04 LAB — HIV ANTIBODY (ROUTINE TESTING W REFLEX): HIV Screen 4th Generation wRfx: NONREACTIVE

## 2019-09-05 ENCOUNTER — Telehealth: Payer: Self-pay

## 2019-09-05 LAB — FECAL OCCULT BLOOD, IMMUNOCHEMICAL: Fecal Occult Bld: NEGATIVE

## 2019-09-05 NOTE — Telephone Encounter (Signed)
-----   Message from Charlott Rakes, MD sent at 09/05/2019 12:59 PM EST ----- Please advise him cholesterol test is normal, A1c is negative for diabetes mellitus

## 2019-09-05 NOTE — Telephone Encounter (Signed)
Patient name and DOB has been verified Patient was informed of lab results. Patient had no questions.  

## 2019-09-06 ENCOUNTER — Telehealth: Payer: Self-pay

## 2019-09-06 NOTE — Telephone Encounter (Signed)
Patient name and DOB has been verified Patient was informed of lab results. Patient had no questions.  

## 2019-09-06 NOTE — Telephone Encounter (Signed)
-----   Message from Charlott Rakes, MD sent at 09/06/2019  9:33 AM EST ----- FIT test (screen for Colon cancer) is negative. Will repeat in 1 year. Thanks

## 2019-09-20 ENCOUNTER — Other Ambulatory Visit: Payer: Self-pay | Admitting: Family Medicine

## 2019-09-20 DIAGNOSIS — J329 Chronic sinusitis, unspecified: Secondary | ICD-10-CM

## 2019-09-20 MED FILL — METOPROLOL TARTRATE 25 MG T: 25 | 30 days supply | Qty: 60 | Fill #0

## 2019-09-20 MED FILL — ?OMEPRAZOLE 20MG CAP DR: 20 | 30 days supply | Qty: 30 | Fill #0

## 2019-09-20 MED FILL — LOSARTAN POTASSIUM 50 MG TA: 50 | 30 days supply | Qty: 30 | Fill #2

## 2019-10-10 MED FILL — AMOXICILLIN 875 MG TABS: 875 | 10 days supply | Qty: 20 | Fill #0

## 2019-10-23 MED FILL — HYDROCORT-PRAMOXINE 1%-1% C: 1-1 | 15 days supply | Qty: 30 | Fill #1

## 2019-10-23 MED FILL — LOSARTAN POTASSIUM 100 MG T: 100 | 30 days supply | Qty: 30 | Fill #0

## 2019-10-23 MED FILL — AMOXICILLIN 875 MG TABS: 875 | 10 days supply | Qty: 20 | Fill #0

## 2019-11-27 MED FILL — ?METOPROLOL TART 25MG TABLE: 25 | 30 days supply | Qty: 60 | Fill #1

## 2019-11-27 MED FILL — LOSARTAN POTASSIUM 50 MG TA: 50 | 30 days supply | Qty: 30 | Fill #3

## 2019-12-02 ENCOUNTER — Telehealth: Payer: Self-pay | Admitting: Family

## 2019-12-04 ENCOUNTER — Ambulatory Visit: Payer: Self-pay | Attending: Family Medicine | Admitting: Physician Assistant

## 2019-12-04 ENCOUNTER — Other Ambulatory Visit: Payer: Self-pay

## 2019-12-04 DIAGNOSIS — K649 Unspecified hemorrhoids: Secondary | ICD-10-CM

## 2019-12-04 DIAGNOSIS — J329 Chronic sinusitis, unspecified: Secondary | ICD-10-CM

## 2019-12-04 MED ORDER — FLUTICASONE PROPIONATE 50 MCG/ACT NA SUSP: 2.0000 | Freq: Every day | NASAL | 6 refills | Status: DC

## 2019-12-04 MED ORDER — HYDROCORTISONE ACETATE 25 MG RE SUPP: 25.0000 mg | Freq: Two times a day (BID) | RECTAL | 1 refills | Status: DC

## 2019-12-04 MED ORDER — AMOXICILLIN 500 MG PO CAPS: 500.0000 mg | ORAL_CAPSULE | Freq: Three times a day (TID) | ORAL | 0 refills | Status: DC

## 2019-12-04 NOTE — Progress Notes (Signed)
States that he is having trouble with his sinus

## 2019-12-04 NOTE — Progress Notes (Signed)
Virtual Visit via Telephone Note  I connected with Timothy Sanchez on 12/04/19 at  3:10 PM EDT by telephone and verified that I am speaking with the correct person using two identifiers.   I discussed the limitations, risks, security and privacy concerns of performing an evaluation and management service by telephone and the availability of in person appointments. I also discussed with the patient that there may be a patient responsible charge related to this service. The patient expressed understanding and agreed to proceed.  PATIENT visit by telephone virtually in the context of Covid-19 pandemic. Patient location: home [name]:  [place] office Persons on the call:  Me and the patient  History of Present Illness: Patient c/o sinus pain and pressure for 2 weeks.  Mucus is thick and yellow green that he blows out of his nose.  No fever.  No cough.    Also c/o hemorrhoids and says "cream doesn't help."  No melena or hematochezia.  Appetite is good.  No abdominal pain.  Some constipation that is not new for him.     Observations/Objective:  NAD.  A&Ox3   Assessment and Plan: 1. Sinusitis, unspecified chronicity, unspecified location Fluids, rest, respiratory care - amoxicillin (AMOXIL) 500 MG capsule; Take 1 capsule (500 mg total) by mouth 3 (three) times daily.  Dispense: 30 capsule; Refill: 0 - fluticasone (FLONASE) 50 MCG/ACT nasal spray; Place 2 sprays into both nostrils daily.  Dispense: 16 g; Refill: 6  2. Hemorrhoids, unspecified hemorrhoid type Fiber, increase water intake. - hydrocortisone (ANUSOL-HC) 25 MG suppository; Place 1 suppository (25 mg total) rectally 2 (two) times daily.  Dispense: 12 suppository; Refill: 1  Follow Up Instructions: prn   I discussed the assessment and treatment plan with the patient. The patient was provided an opportunity to ask questions and all were answered. The patient agreed with the plan and demonstrated an understanding of the  instructions.   The patient was advised to call back or seek an in-person evaluation if the symptoms worsen or if the condition fails to improve as anticipated.  I provided 7 minutes of non-face-to-face time during this encounter.   Timothy Caldron, PA-C  Patient ID: Timothy Sanchez, male   DOB: June 05, 1966, 54 y.o.   MRN: [id number]

## 2019-12-18 MED FILL — METOPROLOL TARTRATE 25 MG T: 25 | 90 days supply | Qty: 180 | Fill #2

## 2019-12-18 MED FILL — LOSARTAN POTASSIUM 50 MG TA: 50 | 90 days supply | Qty: 90 | Fill #4

## 2019-12-27 MED FILL — LOSARTAN POTASSIUM 50 MG TA: 50 | 30 days supply | Qty: 30 | Fill #4

## 2020-01-24 MED FILL — ?METOPROLOL TART 25MG TABLE: 25 | 30 days supply | Qty: 60 | Fill #2

## 2020-01-24 MED FILL — LOSARTAN POTASSIUM 50 MG TA: 50 | 30 days supply | Qty: 30 | Fill #5

## 2020-02-21 MED FILL — LOSARTAN POTASSIUM 50 MG TA: 50 | 30 days supply | Qty: 30 | Fill #6

## 2020-03-20 ENCOUNTER — Other Ambulatory Visit: Payer: Self-pay | Admitting: Family Medicine

## 2020-03-20 MED FILL — ?METOPROLOL TART 25MG TABLE: 25 | 30 days supply | Qty: 60 | Fill #3

## 2020-03-24 ENCOUNTER — Other Ambulatory Visit: Payer: Self-pay | Admitting: Pharmacist

## 2020-03-24 ENCOUNTER — Other Ambulatory Visit: Payer: Self-pay | Admitting: Family Medicine

## 2020-03-24 DIAGNOSIS — I10 Essential (primary) hypertension: Secondary | ICD-10-CM

## 2020-03-24 MED ORDER — LOSARTAN POTASSIUM 50 MG PO TABS: 50.0000 mg | ORAL_TABLET | Freq: Every day | ORAL | 1 refills | Status: DC

## 2020-03-25 MED FILL — LOSARTAN POTASSIUM 50 MG TA: 50 | 30 days supply | Qty: 30 | Fill #0

## 2020-03-25 MED FILL — METOPROLOL TARTRATE 25 MG T: 25 | 30 days supply | Qty: 60 | Fill #3

## 2020-04-21 MED FILL — LOSARTAN POTASSIUM 50 MG TA: 50 | 30 days supply | Qty: 30 | Fill #1

## 2020-04-21 MED FILL — METOPROLOL TARTRATE 25 MG T: 25 | 30 days supply | Qty: 60 | Fill #4

## 2020-05-18 ENCOUNTER — Ambulatory Visit: Payer: Self-pay | Admitting: Family Medicine

## 2020-05-18 ENCOUNTER — Other Ambulatory Visit: Payer: Self-pay | Admitting: Family

## 2020-05-18 ENCOUNTER — Other Ambulatory Visit: Payer: Self-pay

## 2020-05-18 ENCOUNTER — Ambulatory Visit: Payer: 59 | Attending: Family Medicine | Admitting: Family

## 2020-05-18 ENCOUNTER — Encounter: Payer: Self-pay | Admitting: Family

## 2020-05-18 VITALS — BP 137/86 | HR 104 | Wt 205.0 lb

## 2020-05-18 DIAGNOSIS — K219 Gastro-esophageal reflux disease without esophagitis: Secondary | ICD-10-CM

## 2020-05-18 DIAGNOSIS — Z1211 Encounter for screening for malignant neoplasm of colon: Secondary | ICD-10-CM

## 2020-05-18 DIAGNOSIS — E785 Hyperlipidemia, unspecified: Secondary | ICD-10-CM | POA: Diagnosis not present

## 2020-05-18 DIAGNOSIS — I10 Essential (primary) hypertension: Secondary | ICD-10-CM | POA: Diagnosis not present

## 2020-05-18 DIAGNOSIS — Z131 Encounter for screening for diabetes mellitus: Secondary | ICD-10-CM | POA: Diagnosis not present

## 2020-05-18 DIAGNOSIS — K649 Unspecified hemorrhoids: Secondary | ICD-10-CM

## 2020-05-18 MED ORDER — OMEPRAZOLE 20 MG PO CPDR: 20.0000 mg | DELAYED_RELEASE_CAPSULE | Freq: Every day | ORAL | 2 refills | Status: DC

## 2020-05-18 MED ORDER — METOPROLOL TARTRATE 25 MG PO TABS: 25.0000 mg | ORAL_TABLET | Freq: Two times a day (BID) | ORAL | 2 refills | Status: DC

## 2020-05-18 MED ORDER — ATORVASTATIN CALCIUM 40 MG PO TABS: 40.0000 mg | ORAL_TABLET | Freq: Every day | ORAL | 2 refills | Status: DC

## 2020-05-18 MED ORDER — LOSARTAN POTASSIUM 50 MG PO TABS: 50.0000 mg | ORAL_TABLET | Freq: Every day | ORAL | 2 refills | Status: DC

## 2020-05-18 MED FILL — OMEPRAZOLE 20 MG CAP: 20 | 30 days supply | Qty: 30 | Fill #0

## 2020-05-18 MED FILL — METOPROLOL TARTRATE 25 MG T: 25 | 30 days supply | Qty: 60 | Fill #0

## 2020-05-18 MED FILL — LOSARTAN POTASSIUM 50 MG TA: 50 | 30 days supply | Qty: 30 | Fill #0

## 2020-05-18 MED FILL — ATORVASTATIN CALCIUM 40 MG: 40 | 30 days supply | Qty: 30 | Fill #0

## 2020-05-18 NOTE — Progress Notes (Signed)
Patient ID: Timothy Sanchez, male    DOB: 06-23-1966  MRN: [id number]  CC: Medication Refill   Subjective: Timothy Sanchez is a 54 y.o. male with history of essential hypertension, GERD, hypertriglyceridemia, vitamin D deficiency, and spinal stenosis lumbar who presents for medication refills.  1. HYPERTENSION FOLLOW-UP: 07/23/2019: Visit with Dr. Margarita Rana. Stable. Continued on Metoprolol Tartrate and Losartan. CMP and lipid panel obtained.  05/18/2020: Currently taking: see medication list Have you taken your blood pressure medication today: [x]  Yes []  No  Med Adherence: [x]  Yes    []  No Medication side effects: [x]  Yes, dizzy sometimes  Adherence with salt restriction: [x]  Yes, trying Exercise: Yes []  No [x]  Home Monitoring?: [x]  Yes    []  No Monitoring Frequency: [x]  Yes    []  No Home BP results range: [x]  Yes, 125-150/80-90 reports may be a little higher when he initially sits down and when he rechecks it a few minutes later it gradually becomes lower Smoking [x]  Yes, 1.5 pack daily  SOB? []  Yes    [x]  No Chest Pain?: []  Yes    [x]  No Leg swelling?: []  Yes    [x]  No Headaches?: [x]  Yes, sometimes Dizziness? [x]  Yes, sometimes Comments:   2. HYPERLIPIDEMIA FOLLOW-UP: 07/23/2019: Visit with Dr. Margarita Rana. Controlled. Atorvastatin continued.  05/18/2020: Last Lipid Panel results:  HDL  Date Value Ref Range Status  09/03/2019 75 >39 mg/dL Final   Triglycerides  Date Value Ref Range Status  09/03/2019 132 0 - 149 mg/dL Final    Med Adherence: []  Yes    [x]  No, says he doesn't think that he needs them  Diet Adherence: []  Yes    [x]  No  3. GERD FOLLOW-UP: 07/23/2019:  Visit with Dr. Margarita Rana. Stable. Continued on Omeprazole.   05/18/2020: Worse especially after eating spicy foods, citrus/tomatoes, drinking caffeine/coffee. Feels full sometimes. Has gas a lot.   4. HEMORRHOIDS: 12/04/2019: Visit with physician assistant Thereasa Solo. Prescribed Hydrocortisone.    05/18/2020:  Says cream isn't helping much and wants something stronger.  5. EGD REQUEST:  Says he would like to know if there is anything wrong with his throat.  Patient Active Problem List   Diagnosis Date Noted  . Spinal stenosis, lumbar 11/30/2016  . Vitamin D deficiency 09/15/2015  . Hyperlipidemia 09/15/2015  . Hypertriglyceridemia 03/11/2014  . Essential hypertension 03/11/2014  . Essential hypertension, benign 03/28/2013  . GERD (gastroesophageal reflux disease) 03/28/2013  . Nicotine abuse 03/28/2013     Current Outpatient Medications on File Prior to Visit  Medication Sig Dispense Refill  . cetirizine (ZYRTEC) 10 MG tablet Take 1 tablet (10 mg total) by mouth daily. 30 tablet 1  . DULoxetine (CYMBALTA) 60 MG capsule Take 1 capsule (60 mg total) by mouth daily. 30 capsule 3  . fluticasone (FLONASE) 50 MCG/ACT nasal spray Place 2 sprays into both nostrils daily. 16 g 6  . hydrocortisone (ANUSOL-HC) 25 MG suppository Place 1 suppository (25 mg total) rectally 2 (two) times daily. 12 suppository 1  . pramoxine-hydrocortisone (ANALPRAM-HC) 1-1 % rectal cream Place 1 application rectally 2 (two) times daily. 30 g 3  . acetaminophen-codeine (TYLENOL #3) 300-30 MG tablet Take 1 tablet by mouth at bedtime. (Patient not taking: Reported on 07/23/2019) 30 tablet 0  . amoxicillin (AMOXIL) 500 MG capsule Take 1 capsule (500 mg total) by mouth 3 (three) times daily. (Patient not taking: Reported on 05/18/2020) 30 capsule 0  . cyclobenzaprine (FLEXERIL) 10 MG tablet Take 1 tablet (10 mg total)  by mouth 3 (three) times daily as needed for muscle spasms. (Patient not taking: Reported on 07/23/2019) 60 tablet 2  . gabapentin (NEURONTIN) 300 MG capsule Take 1 capsule (300 mg total) by mouth 2 (two) times daily. (Patient not taking: Reported on 07/23/2019) 60 capsule 3   No current facility-administered medications on file prior to visit.    No Known Allergies  Social History    Socioeconomic History  . Marital status: Married    Spouse name: Not on file  . Number of children: Not on file  . Years of education: Not on file  . Highest education level: Not on file  Occupational History  . Not on file  Tobacco Use  . Smoking status: Heavy Tobacco Smoker    Packs/day: 1.50    Years: 25.00    Pack years: 37.50    Types: Cigarettes    Start date: 03/26/2013  . Smokeless tobacco: Never Used  Vaping Use  . Vaping Use: Never used  Substance and Sexual Activity  . Alcohol use: Yes    Alcohol/week: 8.0 standard drinks    Types: 1 Cans of beer, 7 Shots of liquor per week    Comment: daily  . Drug use: No  . Sexual activity: Not on file  Other Topics Concern  . Not on file  Social History Narrative  . Not on file   Social Determinants of Health   Financial Resource Strain:   . Difficulty of Paying Living Expenses: Not on file  Food Insecurity:   . Worried About Charity fundraiser in the Last Year: Not on file  . Ran Out of Food in the Last Year: Not on file  Transportation Needs:   . Lack of Transportation (Medical): Not on file  . Lack of Transportation (Non-Medical): Not on file  Physical Activity:   . Days of Exercise per Week: Not on file  . Minutes of Exercise per Session: Not on file  Stress:   . Feeling of Stress : Not on file  Social Connections:   . Frequency of Communication with Friends and Family: Not on file  . Frequency of Social Gatherings with Friends and Family: Not on file  . Attends Religious Services: Not on file  . Active Member of Clubs or Organizations: Not on file  . Attends Archivist Meetings: Not on file  . Marital Status: Not on file  Intimate Partner Violence:   . Fear of Current or Ex-Partner: Not on file  . Emotionally Abused: Not on file  . Physically Abused: Not on file  . Sexually Abused: Not on file    No family history on file.  No past surgical history on file.  ROS: Review of  Systems Negative except as stated above  PHYSICAL EXAM: BP 137/86 (BP Location: Left Arm, Patient Position: Sitting)   Pulse (!) 104   Wt 205 lb (93 kg)   SpO2 97%   BMI 27.05 kg/m   Physical Exam General appearance - alert, well appearing, and in no distress and oriented to person, place, and time Mental status - alert, oriented to person, place, and time, normal mood, behavior, speech, dress, motor activity, and thought processes Neck - supple, no significant adenopathy Lymphatics - no palpable lymphadenopathy, no hepatosplenomegaly Chest - clear to auscultation, no wheezes, rales or rhonchi, symmetric air entry, no tachypnea, retractions or cyanosis Heart - normal rate, regular rhythm, normal S1, S2, no murmurs, rubs, clicks or gallops Abdomen - soft, nontender, nondistended,  no masses or organomegaly Rectal - declined by patient Neurological - alert, oriented, normal speech, no focal findings or movement disorder noted  ASSESSMENT AND PLAN: 1. Essential hypertension: - Blood pressure close to goal.  - Continue Metoprolol Tartrate and Losartan as prescribed. - Counseled on blood pressure goal of less than 130/80, low-sodium, DASH diet, medication compliance, 150 minutes of moderate intensity exercise per week as tolerated. Discussed medication compliance, adverse effects. - BMP to check kidney function and electrolyte balance.  - Follow-up with primary physician in 3 months or sooner if needed. - Basic Metabolic Panel - metoprolol tartrate (LOPRESSOR) 25 MG tablet; Take 1 tablet (25 mg total) by mouth 2 (two) times daily.  Dispense: 60 tablet; Refill: 2 - losartan (COZAAR) 50 MG tablet; Take 1 tablet (50 mg total) by mouth daily.  Dispense: 30 tablet; Refill: 2  2. Hyperlipidemia, unspecified hyperlipidemia type: - Practice low-fat heart healthy diet and at least 150 minutes of moderate intensity exercise weekly as tolerated.  - Continue Atorvastatin as prescribed. Patient  reports he does not feel as if he needs this medication. Counseled patient that high cholesterol increases risk of heart attack and or stroke. Patient verbalized understanding.  - Last lipid panel obtained 09/03/2019. - Follow-up with primary physician in 3 months or sooner if needed.  - atorvastatin (LIPITOR) 40 MG tablet; Take 1 tablet (40 mg total) by mouth daily.  Dispense: 30 tablet; Refill: 2  3. Gastroesophageal reflux disease without esophagitis: - Continue Omeprazole as prescribed. - Patient requests an EGD. No previous history of EGD. No evidence of alarm features such as but not limited to gastrointestinal bleeding, dysphagia, odynophagia, and persistent vomiting. Referral to Gastroenterology for further evaluation and management to determine if an EGD is indicated at this time. - Follow-up with primary physician in 3 months or sooner if needed. - Ambulatory referral to Gastroenterology - omeprazole (PRILOSEC) 20 MG capsule; Take 1 capsule (20 mg total) by mouth daily.  Dispense: 30 capsule; Refill: 2  4. Diabetes mellitus screening:  - Last hemoglobin A1C obtained 09/03/2019 and was 5.9%. Patient requests repeat hemoglobin A1C during today's visit.  - Hemoglobin A1C to screen for pre-diabetes/diabetes. - Hemoglobin A1c  5. Hemorrhoids, unspecified hemorrhoid type: - Patient reports Hydrocortisone (ANUSOL-HC) suppositories previously prescribed are not helping and would like something stronger.  - Denies bleeding.  - Referral to Gastroenterology for further evaluation and management.  - Follow-up with primary physician as needed.  - Ambulatory referral to Gastroenterology  6. Screening for colon cancer:  - Referral to Gastroenterology for colon cancer screening by colonoscopy.  - Ambulatory referral to Gastroenterology  Patient was given the opportunity to ask questions.  Patient verbalized understanding of the plan and was able to repeat key elements of the plan. Patient was given  clear instructions to go to Emergency Department or return to medical center if symptoms don't improve, worsen, or new problems develop.The patient verbalized understanding.   Orders Placed This Encounter  Procedures  . Basic Metabolic Panel  . Hemoglobin A1c  . Ambulatory referral to Gastroenterology     Requested Prescriptions   Signed Prescriptions Disp Refills  . atorvastatin (LIPITOR) 40 MG tablet 30 tablet 2    Sig: Take 1 tablet (40 mg total) by mouth daily.  . metoprolol tartrate (LOPRESSOR) 25 MG tablet 60 tablet 2    Sig: Take 1 tablet (25 mg total) by mouth 2 (two) times daily.  Marland Kitchen losartan (COZAAR) 50 MG tablet 30 tablet 2  Sig: Take 1 tablet (50 mg total) by mouth daily.  Marland Kitchen omeprazole (PRILOSEC) 20 MG capsule 30 capsule 2    Sig: Take 1 capsule (20 mg total) by mouth daily.    Return in about 3 months (around 08/18/2020) for Dr. Margarita Rana.  Camillia Herter, NP

## 2020-05-18 NOTE — Patient Instructions (Addendum)
Metoprolol and Losartan for high blood pressure.  Atorvastatin for high cholesterol.  Omeprazole for acid reflux.   Referral to Gastroenterology for colonoscopy and hemorrhoids.  Lab today   Follow-up with primary physician in 3 months or sooner if needed.  Hypertension, Adult Hypertension is another name for high blood pressure. High blood pressure forces your heart to work harder to pump blood. This can cause problems over time. There are two numbers in a blood pressure reading. There is a top number (systolic) over a bottom number (diastolic). It is best to have a blood pressure that is below 120/80. Healthy choices can help lower your blood pressure, or you may need medicine to help lower it. What are the causes? The cause of this condition is not known. Some conditions may be related to high blood pressure. What increases the risk?  Smoking.  Having type 2 diabetes mellitus, high cholesterol, or both.  Not getting enough exercise or physical activity.  Being overweight.  Having too much fat, sugar, calories, or salt (sodium) in your diet.  Drinking too much alcohol.  Having long-term (chronic) kidney disease.  Having a family history of high blood pressure.  Age. Risk increases with age.  Race. You may be at higher risk if you are African American.  Gender. Men are at higher risk than women before age 51. After age 53, women are at higher risk than men.  Having obstructive sleep apnea.  Stress. What are the signs or symptoms?  High blood pressure may not cause symptoms. Very high blood pressure (hypertensive crisis) may cause: ? Headache. ? Feelings of worry or nervousness (anxiety). ? Shortness of breath. ? Nosebleed. ? A feeling of being sick to your stomach (nausea). ? Throwing up (vomiting). ? Changes in how you see. ? Very bad chest pain. ? Seizures. How is this treated?  This condition is treated by making healthy lifestyle changes, such  as: ? Eating healthy foods. ? Exercising more. ? Drinking less alcohol.  Your health care provider may prescribe medicine if lifestyle changes are not enough to get your blood pressure under control, and if: ? Your top number is above 130. ? Your bottom number is above 80.  Your personal target blood pressure may vary. Follow these instructions at home: Eating and drinking   If told, follow the DASH eating plan. To follow this plan: ? Fill one half of your plate at each meal with fruits and vegetables. ? Fill one fourth of your plate at each meal with whole grains. Whole grains include whole-wheat pasta, brown rice, and whole-grain bread. ? Eat or drink low-fat dairy products, such as skim milk or low-fat yogurt. ? Fill one fourth of your plate at each meal with low-fat (lean) proteins. Low-fat proteins include fish, chicken without skin, eggs, beans, and tofu. ? Avoid fatty meat, cured and processed meat, or chicken with skin. ? Avoid pre-made or processed food.  Eat less than 1,500 mg of salt each day.  Do not drink alcohol if: ? Your doctor tells you not to drink. ? You are pregnant, may be pregnant, or are planning to become pregnant.  If you drink alcohol: ? Limit how much you use to:  0-1 drink a day for women.  0-2 drinks a day for men. ? Be aware of how much alcohol is in your drink. In the [place] one drink equals one 12 oz bottle of beer (355 mL), one 5 oz glass of wine (148 mL), or one  1 oz glass of hard liquor (44 mL). Lifestyle   Work with your doctor to stay at a healthy weight or to lose weight. Ask your doctor what the best weight is for you.  Get at least 30 minutes of exercise most days of the week. This may include walking, swimming, or biking.  Get at least 30 minutes of exercise that strengthens your muscles (resistance exercise) at least 3 days a week. This may include lifting weights or doing Pilates.  Do not use any products that contain nicotine or  tobacco, such as cigarettes, e-cigarettes, and chewing tobacco. If you need help quitting, ask your doctor.  Check your blood pressure at home as told by your doctor.  Keep all follow-up visits as told by your doctor. This is important. Medicines  Take over-the-counter and prescription medicines only as told by your doctor. Follow directions carefully.  Do not skip doses of blood pressure medicine. The medicine does not work as well if you skip doses. Skipping doses also puts you at risk for problems.  Ask your doctor about side effects or reactions to medicines that you should watch for. Contact a doctor if you:  Think you are having a reaction to the medicine you are taking.  Have headaches that keep coming back (recurring).  Feel dizzy.  Have swelling in your ankles.  Have trouble with your vision. Get help right away if you:  Get a very bad headache.  Start to feel mixed up (confused).  Feel weak or numb.  Feel faint.  Have very bad pain in your: ? Chest. ? Belly (abdomen).  Throw up more than once.  Have trouble breathing. Summary  Hypertension is another name for high blood pressure.  High blood pressure forces your heart to work harder to pump blood.  For most people, a normal blood pressure is less than 120/80.  Making healthy choices can help lower blood pressure. If your blood pressure does not get lower with healthy choices, you may need to take medicine. This information is not intended to replace advice given to you by your health care provider. Make sure you discuss any questions you have with your health care provider. Document Revised: 03/21/2018 Document Reviewed: 03/21/2018 Elsevier Patient Education  2020 [employer].

## 2020-05-19 LAB — HEMOGLOBIN A1C
Est. average glucose Bld gHb Est-mCnc: 123 mg/dL
Hgb A1c MFr Bld: 5.9 % — ABNORMAL HIGH (ref 4.8–5.6)

## 2020-05-19 LAB — BASIC METABOLIC PANEL
BUN/Creatinine Ratio: 27 — ABNORMAL HIGH (ref 9–20)
BUN: 20 mg/dL (ref 6–24)
CO2: 24 mmol/L (ref 20–29)
Calcium: 9.2 mg/dL (ref 8.7–10.2)
Chloride: 101 mmol/L (ref 96–106)
Creatinine, Ser: 0.75 mg/dL — ABNORMAL LOW (ref 0.76–1.27)
GFR calc Af Amer: 121 mL/min/{1.73_m2} (ref >59)
GFR calc non Af Amer: 105 mL/min/{1.73_m2} (ref >59)
Glucose: 98 mg/dL (ref 65–99)
Potassium: 4.6 mmol/L (ref 3.5–5.2)
Sodium: 138 mmol/L (ref 134–144)

## 2020-05-19 NOTE — Progress Notes (Signed)
Please call patient with update.   Kidney function normal. Remember to stay hydrated by drinking water. Keep all appointments with primary physician.

## 2020-05-19 NOTE — Progress Notes (Signed)
Please call patient with update.   Hemoglobin A1C 5.9% which is about the same as your previous of 5.7% and considered pre-diabetic. Remember the importance of healthy eating habits, low-carbohydrate diet, low-sugar diet, and regular aerobic exercise (at least 150 minutes a week as tolerated) to assist with maintaining control of pre-diabetes. Will recheck at next appointment with primary physician.

## 2020-06-22 MED FILL — OMEPRAZOLE 20 MG CAP: 20 | 30 days supply | Qty: 30 | Fill #1

## 2020-06-22 MED FILL — LOSARTAN POTASSIUM 50 MG TA: 50 | 30 days supply | Qty: 30 | Fill #1

## 2020-06-25 ENCOUNTER — Encounter: Payer: Self-pay | Admitting: Gastroenterology

## 2020-07-14 MED FILL — METOPROLOL TARTRATE 25 MG T: 25 | 30 days supply | Qty: 60 | Fill #1

## 2020-07-16 MED FILL — LOSARTAN POTASSIUM 50 MG TA: 50 | 30 days supply | Qty: 30 | Fill #2

## 2020-07-16 MED FILL — OMEPRAZOLE 20 MG CAP: 20 | 30 days supply | Qty: 30 | Fill #2

## 2020-08-18 ENCOUNTER — Encounter: Payer: Self-pay | Admitting: Gastroenterology

## 2020-08-18 ENCOUNTER — Ambulatory Visit: Payer: 59 | Attending: Family Medicine | Admitting: Family Medicine

## 2020-08-18 ENCOUNTER — Other Ambulatory Visit: Payer: Self-pay

## 2020-08-18 ENCOUNTER — Encounter: Payer: Self-pay | Admitting: Family Medicine

## 2020-08-18 ENCOUNTER — Ambulatory Visit (INDEPENDENT_AMBULATORY_CARE_PROVIDER_SITE_OTHER): Payer: 59 | Admitting: Gastroenterology

## 2020-08-18 ENCOUNTER — Other Ambulatory Visit: Payer: Self-pay | Admitting: Family Medicine

## 2020-08-18 VITALS — BP 146/100 | HR 116 | Ht 71.5 in | Wt 204.2 lb

## 2020-08-18 VITALS — BP 149/83 | HR 115 | Ht 71.0 in | Wt 207.0 lb

## 2020-08-18 DIAGNOSIS — I10 Essential (primary) hypertension: Secondary | ICD-10-CM

## 2020-08-18 DIAGNOSIS — K5909 Other constipation: Secondary | ICD-10-CM

## 2020-08-18 DIAGNOSIS — M48062 Spinal stenosis, lumbar region with neurogenic claudication: Secondary | ICD-10-CM | POA: Diagnosis not present

## 2020-08-18 DIAGNOSIS — K219 Gastro-esophageal reflux disease without esophagitis: Secondary | ICD-10-CM

## 2020-08-18 DIAGNOSIS — K649 Unspecified hemorrhoids: Secondary | ICD-10-CM | POA: Diagnosis not present

## 2020-08-18 DIAGNOSIS — Z1211 Encounter for screening for malignant neoplasm of colon: Secondary | ICD-10-CM

## 2020-08-18 DIAGNOSIS — Z1212 Encounter for screening for malignant neoplasm of rectum: Secondary | ICD-10-CM

## 2020-08-18 MED ORDER — METHOCARBAMOL 500 MG PO TABS: 500.0000 mg | ORAL_TABLET | Freq: Two times a day (BID) | ORAL | 1 refills | Status: DC | PRN

## 2020-08-18 MED ORDER — PLENVU 140 G PO SOLR: 1.0000 | Freq: Once | ORAL | 0 refills | Status: DC

## 2020-08-18 MED ORDER — METOPROLOL TARTRATE 50 MG PO TABS: 50.0000 mg | ORAL_TABLET | Freq: Two times a day (BID) | ORAL | 6 refills | Status: DC

## 2020-08-18 MED ORDER — POLYETHYLENE GLYCOL 3350 17 GM/SCOOP PO POWD: 17.0000 g | Freq: Two times a day (BID) | ORAL | 1 refills | Status: DC | PRN

## 2020-08-18 MED ORDER — LOSARTAN POTASSIUM 50 MG PO TABS: 50.0000 mg | ORAL_TABLET | Freq: Every day | ORAL | 6 refills | Status: DC

## 2020-08-18 MED FILL — LOSARTAN POTASSIUM 50 MG TA: 50 | 30 days supply | Qty: 30 | Fill #0

## 2020-08-18 MED FILL — METHOCARBAMOL 500 MG TABS: 500 | 30 days supply | Qty: 60 | Fill #0

## 2020-08-18 MED FILL — METOPROLOL TARTRATE 50 MG T: 50 | 30 days supply | Qty: 60 | Fill #0

## 2020-08-18 NOTE — Progress Notes (Signed)
Hemorrhoids are very bad.

## 2020-08-18 NOTE — Progress Notes (Signed)
History of Present Illness: This is a 55 year old male referred by Charlott Rakes, MD for the evaluation of GERD and colon cancer screening. He relates some 8 to 10-year history of reflux and symptoms are generally well controlled on daily Prilosec. He does have occasional breakthrough symptoms. For the past several months he notes urgent bowel movements intermittently after meals. He has intermittent difficulties with constipation and straining. He occasionally notes mild hemorrhoid prolapse and discomfort with bowel movements. He was treated with Anusol HC suppositories last year with improvement in symptoms. No prior colonoscopy or upper endoscopy. Denies weight loss, abdominal pain, diarrhea, change in stool caliber, melena, hematochezia, nausea, vomiting, dysphagia, chest pain.    No Known Allergies Outpatient Medications Prior to Visit  Medication Sig Dispense Refill  . cetirizine (ZYRTEC) 10 MG tablet Take 1 tablet (10 mg total) by mouth daily. 30 tablet 1  . Cholecalciferol (VITAMIN D3 PO) Take 1 tablet by mouth daily.    . Cyanocobalamin (VITAMIN B-12 PO) Take 1 tablet by mouth daily.    . fluticasone (FLONASE) 50 MCG/ACT nasal spray Place 2 sprays into both nostrils daily. 16 g 6  . losartan (COZAAR) 50 MG tablet Take 1 tablet (50 mg total) by mouth daily. 30 tablet 2  . metoprolol tartrate (LOPRESSOR) 25 MG tablet Take 1 tablet (25 mg total) by mouth 2 (two) times daily. 60 tablet 2  . omeprazole (PRILOSEC) 20 MG capsule Take 1 capsule (20 mg total) by mouth daily. 30 capsule 2  . acetaminophen-codeine (TYLENOL #3) 300-30 MG tablet Take 1 tablet by mouth at bedtime. (Patient not taking: Reported on 07/23/2019) 30 tablet 0  . amoxicillin (AMOXIL) 500 MG capsule Take 1 capsule (500 mg total) by mouth 3 (three) times daily. (Patient not taking: Reported on 05/18/2020) 30 capsule 0  . atorvastatin (LIPITOR) 40 MG tablet Take 1 tablet (40 mg total) by mouth daily. 30 tablet 2  .  cyclobenzaprine (FLEXERIL) 10 MG tablet Take 1 tablet (10 mg total) by mouth 3 (three) times daily as needed for muscle spasms. (Patient not taking: Reported on 07/23/2019) 60 tablet 2  . DULoxetine (CYMBALTA) 60 MG capsule Take 1 capsule (60 mg total) by mouth daily. 30 capsule 3  . gabapentin (NEURONTIN) 300 MG capsule Take 1 capsule (300 mg total) by mouth 2 (two) times daily. (Patient not taking: Reported on 07/23/2019) 60 capsule 3  . hydrocortisone (ANUSOL-HC) 25 MG suppository Place 1 suppository (25 mg total) rectally 2 (two) times daily. 12 suppository 1  . pramoxine-hydrocortisone (ANALPRAM-HC) 1-1 % rectal cream Place 1 application rectally 2 (two) times daily. 30 g 3   No facility-administered medications prior to visit.   Past Medical History:  Diagnosis Date  . GERD (gastroesophageal reflux disease)   . Hemorrhoids   . Hyperlipidemia   . Hypertension   . Spinal stenosis of lumbar region   . Vitamin D deficiency    Past Surgical History:  Procedure Laterality Date  . NO PAST SURGERIES     Social History   Socioeconomic History  . Marital status: Married    Spouse name: Not on file  . Number of children: 0  . Years of education: Not on file  . Highest education level: Not on file  Occupational History  . Occupation: Delivery driver  Tobacco Use  . Smoking status: Heavy Tobacco Smoker    Packs/day: 1.50    Years: 25.00    Pack years: 37.50    Types: Cigarettes  Start date: 03/26/2013  . Smokeless tobacco: Never Used  Vaping Use  . Vaping Use: Never used  Substance and Sexual Activity  . Alcohol use: Yes    Alcohol/week: 8.0 standard drinks    Types: 1 Cans of beer, 7 Shots of liquor per week    Comment: daily  . Drug use: No  . Sexual activity: Not on file  Other Topics Concern  . Not on file  Social History Narrative   ** Merged History Encounter **       Social Determinants of Health   Financial Resource Strain: Not on file  Food Insecurity: Not  on file  Transportation Needs: Not on file  Physical Activity: Not on file  Stress: Not on file  Social Connections: Not on file   Family History  Problem Relation Age of Onset  . Diabetes Mother   . Hypertension Mother       Review of Systems: Pertinent positive and negative review of systems were noted in the above HPI section. All other review of systems were otherwise negative.   Physical Exam: General: Well developed, well nourished, no acute distress Head: Normocephalic and atraumatic Eyes:  sclerae anicteric, EOMI Ears: Normal auditory acuity Mouth: Not examined, mask on during Covid-19 pandemic Neck: Supple, no masses or thyromegaly Lungs: Clear throughout to auscultation Heart: Regular rate and rhythm; no murmurs, rubs or bruits Abdomen: Soft, non tender and non distended. No masses, hepatosplenomegaly or hernias noted. Normal Bowel sounds Rectal: Deferred to colonoscopy Musculoskeletal: Symmetrical with no gross deformities  Skin: No lesions on visible extremities Pulses:  Normal pulses noted Extremities: No clubbing, cyanosis, edema or deformities noted Neurological: Alert oriented x 4, grossly nonfocal Cervical Nodes:  No significant cervical adenopathy Inguinal Nodes: No significant inguinal adenopathy Psychological:  Alert and cooperative. Normal mood and affect   Assessment and Recommendations:  1. GERD. Rule out esophagitis, Barrett's esophagus. Follow standard antireflux measures. Continue omeprazole 20 mg daily. TUMS as needed for breakthrough symptoms. Schedule EGD. The risks (including bleeding, perforation, infection, missed lesions, medication reactions and possible hospitalization or surgery if complications occur), benefits, and alternatives to endoscopy with possible biopsy and possible dilation were discussed with the patient and they consent to proceed.   2. CRC screening, average risk. Schedule colonoscopy. The risks (including bleeding,  perforation, infection, missed lesions, medication reactions and possible hospitalization or surgery if complications occur), benefits, and alternatives to colonoscopy with possible biopsy and possible polypectomy were discussed with the patient and they consent to proceed.   3. Suspected Grade II internal hemorrhoids. DRE and further evaluation at time of colonoscopy. Avoid straining with bowel movements. Increase daily fiber and water intake.   cc: Charlott Rakes, MD [place],  [place] [id number]

## 2020-08-18 NOTE — Patient Instructions (Signed)
You have been scheduled for an endoscopy and colonoscopy. Please follow the written instructions given to you at your visit today. Please pick up your prep supplies at the pharmacy within the next 1-3 days. If you use inhalers (even only as needed), please bring them with you on the day of your procedure.   Patient advised to avoid spicy, acidic, citrus, chocolate, mints, fruit and fruit juices.  Limit the intake of caffeine, alcohol and Soda.  Don't exercise too soon after eating.  Don't lie down within 3-4 hours of eating.  Elevate the head of your bed.  Thank you for choosing me and [facility].  Malcolm T. Stark, Jr., MD., FACG  

## 2020-08-18 NOTE — Patient Instructions (Signed)

## 2020-08-18 NOTE — Progress Notes (Unsigned)
Subjective:  Patient ID: Timothy Sanchez, male    DOB: [birth year]  Age: 55 y.o. 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 He has painful hemoirrhoids which have worsened over the last couple of days.  He has only had a single episode of bleeding from the hemorrhoid.  Analpram has not provided relief. He is compliant with his antihypertensive and his statin and reflux is controlled on his PPI.  He had a visit with GI today and is being worked up for an upper endoscopy and colonoscopy. His spinal stenosis manifest with low back pain which initially radiated to the right lower extremity but now radiates to the left lower extremity.  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  Currently has no medications for pain.  Past Medical History:  Diagnosis Date  . GERD (gastroesophageal reflux disease)   . Hemorrhoids   . Hyperlipidemia   . Hypertension   . Spinal stenosis of lumbar region   . Vitamin D deficiency     Past Surgical History:  Procedure Laterality Date  . NO PAST SURGERIES      Family History  Problem Relation Age of Onset  . Diabetes Mother   . Hypertension Mother     No Known Allergies  Outpatient Medications Prior to Visit  Medication Sig Dispense Refill  . cetirizine (ZYRTEC) 10 MG tablet Take 1 tablet (10 mg total) by mouth daily. 30 tablet 1  . Cholecalciferol (VITAMIN D3 PO) Take 1 tablet by mouth daily.    . Cyanocobalamin (VITAMIN B-12 PO) Take 1 tablet by mouth daily.    . fluticasone (FLONASE) 50 MCG/ACT nasal spray Place 2 sprays into both nostrils daily. 16 g 6  . losartan (COZAAR) 50 MG tablet Take 1 tablet (50 mg total) by mouth daily. 30 tablet 2  . metoprolol tartrate (LOPRESSOR) 25 MG tablet Take 1 tablet (25 mg  total) by mouth 2 (two) times daily. 60 tablet 2  . omeprazole (PRILOSEC) 20 MG capsule Take 1 capsule (20 mg total) by mouth daily. 30 capsule 2  . PEG-KCl-NaCl-NaSulf-Na Asc-C (PLENVU) 140 g SOLR Take 1 kit by mouth once for 1 dose. 1 each 0   No facility-administered medications prior to visit.     ROS Review of Systems  Constitutional: Negative for activity change and appetite change.  HENT: Negative for sinus pressure and sore throat.   Eyes: Negative for visual disturbance.  Respiratory: Negative for cough, chest tightness and shortness of breath.   Cardiovascular: Negative for chest pain and leg swelling.  Gastrointestinal: Negative for abdominal distention, abdominal pain, constipation and diarrhea.  Endocrine: Negative.   Genitourinary: Negative for dysuria.  Musculoskeletal: Negative for joint swelling and myalgias.  Skin: Negative for rash.  Allergic/Immunologic: Negative.   Neurological: Negative for weakness, light-headedness and numbness.  Psychiatric/Behavioral: Negative for dysphoric mood and suicidal ideas.    Objective:  BP (!) 149/83   Pulse (!) 115   Ht 5' 11"  (1.803 m)   Wt 207 lb (93.9 kg)   SpO2 96%   BMI 28.87 kg/m   BP/Weight 08/18/2020 08/18/2020 99/24/2683  Systolic BP 419 622 297  Diastolic BP 989 83 86  Wt. (Lbs) 204.25 207 205  BMI 28.09 28.87 27.05  Physical Exam Constitutional:      Appearance: He is well-developed.  Neck:     Vascular: No JVD.  Cardiovascular:     Rate and Rhythm: Normal rate.     Heart sounds: Normal heart sounds. No murmur heard.   Pulmonary:     Effort: Pulmonary effort is normal.     Breath sounds: Normal breath sounds. No wheezing or rales.  Chest:     Chest wall: No tenderness.  Abdominal:     General: Bowel sounds are normal. There is no distension.     Palpations: Abdomen is soft. There is no mass.     Tenderness: There is no abdominal tenderness.  Musculoskeletal:        General: Tenderness  (slight TTP of lumbar spine) present.     Right lower leg: No edema.     Left lower leg: No edema.     Comments: Positive straight leg raise on the left  Neurological:     Mental Status: He is alert and oriented to person, place, and time.  Psychiatric:        Mood and Affect: Mood normal.     CMP Latest Ref Rng & Units 05/18/2020 09/03/2019 07/30/2019  Glucose 65 - 99 mg/dL 98 101(H) 79  BUN 6 - 24 mg/dL 20 17 14   Creatinine 0.76 - 1.27 mg/dL 0.75(L) 0.91 0.77  Sodium 134 - 144 mmol/L 138 139 140  Potassium 3.5 - 5.2 mmol/L 4.6 4.2 4.4  Chloride 96 - 106 mmol/L 101 99 99  CO2 20 - 29 mmol/L 24 25 24   Calcium 8.7 - 10.2 mg/dL 9.2 9.4 9.1  Total Protein 6.0 - 8.5 g/dL - 6.6 6.4  Total Bilirubin 0.0 - 1.2 mg/dL - 0.5 0.3  Alkaline Phos 39 - 117 IU/L - 71 59  AST 0 - 40 IU/L - 34 23  ALT 0 - 44 IU/L - 32 19    Lipid Panel     Component Value Date/Time   CHOL 157 09/03/2019 1541   TRIG 132 09/03/2019 1541   HDL 75 09/03/2019 1541   CHOLHDL 2.1 09/03/2019 1541   CHOLHDL 2.0 05/18/2016 1511   VLDL 16 05/18/2016 1511   LDLCALC 60 09/03/2019 1541    CBC    Component Value Date/Time   WBC 5.5 01/28/2018 1841   RBC 5.50 01/28/2018 1841   HGB 16.3 01/28/2018 1852   HCT 48.0 01/28/2018 1852   PLT 190 01/28/2018 1841   MCV 86.0 01/28/2018 1841   MCH 27.1 01/28/2018 1841   MCHC 31.5 01/28/2018 1841   RDW 14.6 01/28/2018 1841   LYMPHSABS 1.7 01/28/2018 1841   MONOABS 0.5 01/28/2018 1841   EOSABS 0.2 01/28/2018 1841   BASOSABS 0.0 01/28/2018 1841    Lab Results  Component Value Date   HGBA1C 5.9 (H) 05/18/2020    Assessment & Plan:  1. Essential hypertension Slightly above goal Metoprolol dose increased Counseled on blood pressure goal of less than 130/80, low-sodium, DASH diet, medication compliance, 150 minutes of moderate intensity exercise per week. Discussed medication compliance, adverse effects. - metoprolol tartrate (LOPRESSOR) 50 MG tablet; Take 1 tablet (50  mg total) by mouth 2 (two) times daily.  Dispense: 60 tablet; Refill: 6 - losartan (COZAAR) 50 MG tablet; Take 1 tablet (50 mg total) by mouth daily.  Dispense: 30 tablet; Refill: 6  2. Hemorrhoids, unspecified hemorrhoid type Uncontrolled on Analpram Constipation also contributing He may need ligation or excision We will place on Dr. Hulen Skains  schedule  3. Spinal stenosis of lumbar region with neurogenic claudication Uncontrolled Prolonged sitting at his job also contributing We will place on Robaxin to be used as needed-discussed sedating side effects - methocarbamol (ROBAXIN) 500 MG tablet; Take 1 tablet (500 mg total) by mouth 2 (two) times daily as needed for muscle spasms.  Dispense: 60 tablet; Refill: 1  4. Other constipation Advised to increase fiber intake - polyethylene glycol powder (GLYCOLAX/MIRALAX) 17 GM/SCOOP powder; Take 17 g by mouth 2 (two) times daily as needed.  Dispense: 3350 g; Refill: 1   No orders of the defined types were placed in this encounter.   Return for Hemorrhoids please place on Dr. Hulen Skains schedule.  6 months with PCP.       Charlott Rakes, MD, FAAFP. [facility] and Dixie Donnelly, [place]   08/18/2020, 4:00 PM

## 2020-08-27 ENCOUNTER — Encounter: Payer: Self-pay | Admitting: Gastroenterology

## 2020-08-27 ENCOUNTER — Other Ambulatory Visit: Payer: Self-pay | Admitting: Family

## 2020-08-27 ENCOUNTER — Ambulatory Visit: Payer: 59 | Admitting: General Surgery

## 2020-08-27 ENCOUNTER — Other Ambulatory Visit: Payer: Self-pay

## 2020-08-27 DIAGNOSIS — K219 Gastro-esophageal reflux disease without esophagitis: Secondary | ICD-10-CM

## 2020-08-28 ENCOUNTER — Other Ambulatory Visit: Payer: Self-pay | Admitting: Internal Medicine

## 2020-08-28 MED FILL — OMEPRAZOLE 20 MG CAP: 20 | 30 days supply | Qty: 30 | Fill #0

## 2020-08-31 ENCOUNTER — Ambulatory Visit: Payer: Self-pay | Admitting: Gastroenterology

## 2020-08-31 ENCOUNTER — Ambulatory Visit (AMBULATORY_SURGERY_CENTER): Payer: 59 | Admitting: Gastroenterology

## 2020-08-31 ENCOUNTER — Other Ambulatory Visit: Payer: Self-pay

## 2020-08-31 ENCOUNTER — Other Ambulatory Visit: Payer: Self-pay | Admitting: Gastroenterology

## 2020-08-31 ENCOUNTER — Encounter: Payer: Self-pay | Admitting: Gastroenterology

## 2020-08-31 VITALS — BP 144/94 | HR 111 | Temp 96.2°F | Resp 21 | Ht 71.0 in | Wt 204.0 lb

## 2020-08-31 DIAGNOSIS — Z1211 Encounter for screening for malignant neoplasm of colon: Secondary | ICD-10-CM

## 2020-08-31 DIAGNOSIS — D125 Benign neoplasm of sigmoid colon: Secondary | ICD-10-CM

## 2020-08-31 DIAGNOSIS — K219 Gastro-esophageal reflux disease without esophagitis: Secondary | ICD-10-CM

## 2020-08-31 DIAGNOSIS — K3189 Other diseases of stomach and duodenum: Secondary | ICD-10-CM | POA: Diagnosis not present

## 2020-08-31 DIAGNOSIS — D128 Benign neoplasm of rectum: Secondary | ICD-10-CM | POA: Diagnosis not present

## 2020-08-31 DIAGNOSIS — Z1212 Encounter for screening for malignant neoplasm of rectum: Secondary | ICD-10-CM | POA: Diagnosis not present

## 2020-08-31 DIAGNOSIS — D124 Benign neoplasm of descending colon: Secondary | ICD-10-CM

## 2020-08-31 MED ORDER — SODIUM CHLORIDE 0.9 % IV SOLN
500.0000 mL | Freq: Once | INTRAVENOUS | Status: DC
Start: 2020-08-31 — End: 2020-08-31

## 2020-08-31 MED ORDER — PANTOPRAZOLE SODIUM 40 MG PO TBEC: 40.0000 mg | DELAYED_RELEASE_TABLET | Freq: Every day | ORAL | 3 refills | Status: DC

## 2020-08-31 MED FILL — PANTOPRAZOLE SOD DR 40 MG T: 40 | 30 days supply | Qty: 30 | Fill #0

## 2020-08-31 NOTE — Op Note (Signed)
[facility] Patient Name: Timothy Sanchez Procedure Date: 08/31/2020 2:41 PM MRN: [id number] Endoscopist: Meryl Dare , MD Age: 55 Referring MD:  Date of Birth: [birth year] Gender: Male Account #: [id number] Procedure:                Colonoscopy Indications:              Screening for colorectal malignant neoplasm Medicines:                Monitored Anesthesia Care Procedure:                Pre-Anesthesia Assessment:                           - Prior to the procedure, a History and Physical                            was performed, and patient medications and                            allergies were reviewed. The patient's tolerance of                            previous anesthesia was also reviewed. The risks                            and benefits of the procedure and the sedation                            options and risks were discussed with the patient.                            All questions were answered, and informed consent                            was obtained. Prior Anticoagulants: The patient has                            taken no previous anticoagulant or antiplatelet                            agents. ASA Grade Assessment: II - A patient with                            mild systemic disease. After reviewing the risks                            and benefits, the patient was deemed in                            satisfactory condition to undergo the procedure.                           After obtaining informed consent, the colonoscope  was passed under direct vision. Throughout the                            procedure, the patient's blood pressure, pulse, and                            oxygen saturations were monitored continuously. The                            Olympus CF-HQ190 [id number]) Colonoscope was                            introduced through the anus and advanced to the the                            cecum, identified by  appendiceal orifice and                            ileocecal valve. The ileocecal valve, appendiceal                            orifice, and rectum were photographed. The quality                            of the bowel preparation was good. The colonoscopy                            was performed without difficulty. The patient                            tolerated the procedure well. Scope In: 2:46:07 PM Scope Out: 3:01:10 PM Scope Withdrawal Time: 0 hours 13 minutes 23 seconds  Total Procedure Duration: 0 hours 15 minutes 3 seconds  Findings:                 The perianal and digital rectal examinations were                            normal.                           A 8 mm polyp was found in the descending colon. The                            polyp was sessile. The polyp was removed with a                            cold snare. Resection and retrieval were complete.                           A 4 mm polyp was found in the rectum. The polyp was                            sessile. The polyp was removed with  a cold biopsy                            forceps. Resection and retrieval were complete.                           Internal hemorrhoids were found during                            retroflexion. The hemorrhoids were small and Grade                            I (internal hemorrhoids that do not prolapse).                           The exam was otherwise without abnormality on                            direct and retroflexion views. Complications:            No immediate complications. Estimated blood loss:                            None. Estimated Blood Loss:     Estimated blood loss: none. Impression:               - One 8 mm polyp in the descending colon, removed                            with a cold snare. Resected and retrieved.                           - One 4 mm polyp in the rectum, removed with a cold                            biopsy forceps. Resected and retrieved.                            - Internal hemorrhoids.                           - The examination was otherwise normal on direct                            and retroflexion views. Recommendation:           - Repeat colonoscopy after studies are complete for                            surveillance based on pathology results.                           - Patient has a contact number available for                            emergencies. The signs and symptoms of potential  delayed complications were discussed with the                            patient. Return to normal activities tomorrow.                            Written discharge instructions were provided to the                            patient.                           - Resume previous diet.                           - Continue present medications.                           - Await pathology results. Meryl Dare, MD 08/31/2020 3:05:00 PM This report has been signed electronically.

## 2020-08-31 NOTE — Patient Instructions (Signed)
Read all of the handouts given to you by your recovery room nurse.  Resume all of your medications today including your stomach medication.  Be sure to take it on an empty stomach 1/2 hour before you eat.  YOU HAD AN ENDOSCOPIC PROCEDURE TODAY AT [facility]:   Refer to the procedure report that was given to you for any specific questions about what was found during the examination.  If the procedure report does not answer your questions, please call your gastroenterologist to clarify.  If you requested that your care partner not be given the details of your procedure findings, then the procedure report has been included in a sealed envelope for you to review at your convenience later.  YOU SHOULD EXPECT: Some feelings of bloating in the abdomen. Passage of more gas than usual.  Walking can help get rid of the air that was put into your GI tract during the procedure and reduce the bloating. If you had a lower endoscopy (such as a colonoscopy or flexible sigmoidoscopy) you may notice spotting of blood in your stool or on the toilet paper. If you underwent a bowel prep for your procedure, you may not have a normal bowel movement for a few days.  Please Note:  You might notice some irritation and congestion in your nose or some drainage.  This is from the oxygen used during your procedure.  There is no need for concern and it should clear up in a day or so.  SYMPTOMS TO REPORT IMMEDIATELY:   Following lower endoscopy (colonoscopy or flexible sigmoidoscopy):  Excessive amounts of blood in the stool  Significant tenderness or worsening of abdominal pains  Swelling of the abdomen that is new, acute  Fever of 100F or higher   Following upper endoscopy (EGD)  Vomiting of blood or coffee ground material  New chest pain or pain under the shoulder blades  Painful or persistently difficult swallowing  New shortness of breath  Fever of 100F or higher  Black, tarry-looking stools  For  urgent or emergent issues, a gastroenterologist can be reached at any hour by calling [phone]. Do not use MyChart messaging for urgent concerns.    DIET:  We do recommend a small meal at first, but then you may proceed to your regular diet.  Drink plenty of fluids but you should avoid alcoholic beverages for 24 hours.  ACTIVITY:  You should plan to take it easy for the rest of today and you should NOT DRIVE or use heavy machinery until tomorrow (because of the sedation medicines used during the test).    FOLLOW UP: Our staff will call the number listed on your records 48-72 hours following your procedure to check on you and address any questions or concerns that you may have regarding the information given to you following your procedure. If we do not reach you, we will leave a message.  We will attempt to reach you two times.  During this call, we will ask if you have developed any symptoms of COVID 19. If you develop any symptoms (ie: fever, flu-like symptoms, shortness of breath, cough etc.) before then, please call [phone].  If you test positive for Covid 19 in the 2 weeks post procedure, please call and report this information to [place].    If any biopsies were taken you will be contacted by phone or by letter within the next 1-3 weeks.  Please call us at [phone] if you have not heard  about the biopsies in 3 weeks.    SIGNATURES/CONFIDENTIALITY: You and/or your care partner have signed paperwork which will be entered into your electronic medical record.  These signatures attest to the fact that that the information above on your After Visit Summary has been reviewed and is understood.  Full responsibility of the confidentiality of this discharge information lies with you and/or your care-partner.

## 2020-08-31 NOTE — Progress Notes (Signed)
Report to PACU, RN, vss, BBS= Clear.  

## 2020-08-31 NOTE — Op Note (Signed)
[facility] Patient Name: Timothy Sanchez Procedure Date: 08/31/2020 2:41 PM MRN: [id number] Endoscopist: Ladene Artist , MD Age: 55 Referring MD:  Date of Birth: 10/08/1965 Gender: Male Account #: [id number] Procedure:                Upper GI endoscopy Indications:              Gastroesophageal reflux disease. Screen for                            Barrett's esophagus. Medicines:                Monitored Anesthesia Care Procedure:                Pre-Anesthesia Assessment:                           - Prior to the procedure, a History and Physical                            was performed, and patient medications and                            allergies were reviewed. The patient's tolerance of                            previous anesthesia was also reviewed. The risks                            and benefits of the procedure and the sedation                            options and risks were discussed with the patient.                            All questions were answered, and informed consent                            was obtained. Prior Anticoagulants: The patient has                            taken no previous anticoagulant or antiplatelet                            agents. ASA Grade Assessment: II - A patient with                            mild systemic disease. After reviewing the risks                            and benefits, the patient was deemed in                            satisfactory condition to undergo the procedure.  After obtaining informed consent, the endoscope was                            passed under direct vision. Throughout the                            procedure, the patient's blood pressure, pulse, and                            oxygen saturations were monitored continuously. The                            Endoscope was introduced through the mouth, and                            advanced to the second part of duodenum. The  upper                            GI endoscopy was accomplished without difficulty.                            The patient tolerated the procedure well. Scope In: Scope Out: Findings:                 The examined esophagus was normal.                           A small hiatal hernia was present.                           Patchy mildly erythematous mucosa without bleeding                            was found in the gastric fundus and in the gastric                            body. Biopsies were taken with a cold forceps for                            histology.                           The exam of the stomach was otherwise normal.                           The duodenal bulb and second portion of the                            duodenum were normal. Complications:            No immediate complications. Estimated Blood Loss:     Estimated blood loss was minimal. Impression:               - Normal esophagus.                           -  Small hiatal hernia.                           - Erythematous mucosa in the gastric fundus and                            gastric body. Biopsied.                           - Normal duodenal bulb and second portion of the                            duodenum. Recommendation:           - Patient has a contact number available for                            emergencies. The signs and symptoms of potential                            delayed complications were discussed with the                            patient. Return to normal activities tomorrow.                            Written discharge instructions were provided to the                            patient.                           - Resume previous diet.                           - Follow antireflux measures long term.                           - Continue present medications including omeprazole                            20 mg po qd.                           - Await pathology results. Ladene Artist,  MD 08/31/2020 3:19:15 PM This report has been signed electronically.

## 2020-08-31 NOTE — Progress Notes (Signed)
Called to room to assist during endoscopic procedure.  Patient ID and intended procedure confirmed with present staff. Received instructions for my participation in the procedure from the performing physician.  

## 2020-08-31 NOTE — Progress Notes (Signed)
VS by CW. ?

## 2020-09-02 ENCOUNTER — Telehealth: Payer: Self-pay | Admitting: *Deleted

## 2020-09-02 NOTE — Telephone Encounter (Signed)
  Follow up Call-  Call back number 08/31/2020  Post procedure Call Back phone  # [phone]  Permission to leave phone message Yes  Some recent data might be hidden     Patient questions:  Do you have a fever, pain , or abdominal swelling? No. Pain Score  0 *  Have you tolerated food without any problems? Yes.    Have you been able to return to your normal activities? Yes.    Do you have any questions about your discharge instructions: Diet   No. Medications  No. Follow up visit  No.  Do you have questions or concerns about your Care? No.  Actions: * If pain score is 4 or above: No action needed, pain <4.  1. Have you developed a fever since your procedure? no  2.   Have you had an respiratory symptoms (SOB or cough) since your procedure? no  3.   Have you tested positive for COVID 19 since your procedure no  4.   Have you had any family members/close contacts diagnosed with the COVID 19 since your procedure?  no   If yes to any of these questions please route to Joylene John, RN and Joella Prince, RN

## 2020-09-17 ENCOUNTER — Ambulatory Visit: Payer: 59 | Admitting: General Surgery

## 2020-09-18 MED FILL — LOSARTAN POTASSIUM 50 MG TA: 50 | 30 days supply | Qty: 30 | Fill #1

## 2020-09-24 ENCOUNTER — Encounter: Payer: Self-pay | Admitting: Gastroenterology

## 2020-09-29 ENCOUNTER — Ambulatory Visit: Payer: 59 | Admitting: General Surgery

## 2020-09-30 MED FILL — PANTOPRAZOLE SOD DR 40 MG T: 40 | 30 days supply | Qty: 30 | Fill #1

## 2020-10-14 MED FILL — METOPROLOL TARTRATE 50 MG T: 50 | 30 days supply | Qty: 60 | Fill #1

## 2020-10-16 MED FILL — LOSARTAN POTASSIUM 50 MG TA: 50 | 30 days supply | Qty: 30 | Fill #2

## 2020-10-24 ENCOUNTER — Other Ambulatory Visit: Payer: Self-pay

## 2020-11-19 MED FILL — Losartan Potassium Tab 50 MG: ORAL | 30 days supply | Qty: 30 | Fill #0 | Status: AC

## 2020-11-20 ENCOUNTER — Other Ambulatory Visit: Payer: Self-pay

## 2020-11-20 MED FILL — Omeprazole Cap Delayed Release 20 MG: ORAL | 30 days supply | Qty: 30 | Fill #0 | Status: CN

## 2020-11-20 MED FILL — Pantoprazole Sodium EC Tab 40 MG (Base Equiv): ORAL | 90 days supply | Qty: 90 | Fill #0 | Status: CN

## 2020-11-27 ENCOUNTER — Other Ambulatory Visit: Payer: Self-pay

## 2020-12-01 ENCOUNTER — Other Ambulatory Visit: Payer: Self-pay

## 2020-12-01 MED FILL — Metoprolol Tartrate Tab 50 MG: ORAL | 30 days supply | Qty: 60 | Fill #0 | Status: AC

## 2020-12-01 MED FILL — Omeprazole Cap Delayed Release 20 MG: ORAL | 30 days supply | Qty: 30 | Fill #0 | Status: CN

## 2020-12-01 MED FILL — Pantoprazole Sodium EC Tab 40 MG (Base Equiv): ORAL | 30 days supply | Qty: 30 | Fill #0 | Status: AC

## 2020-12-02 ENCOUNTER — Other Ambulatory Visit: Payer: Self-pay

## 2020-12-17 MED FILL — Losartan Potassium Tab 50 MG: ORAL | 30 days supply | Qty: 30 | Fill #1 | Status: AC

## 2020-12-18 ENCOUNTER — Other Ambulatory Visit: Payer: Self-pay

## 2021-01-01 ENCOUNTER — Other Ambulatory Visit: Payer: Self-pay

## 2021-01-01 MED FILL — Pantoprazole Sodium EC Tab 40 MG (Base Equiv): ORAL | 30 days supply | Qty: 30 | Fill #1 | Status: CN

## 2021-01-01 MED FILL — Metoprolol Tartrate Tab 50 MG: ORAL | 30 days supply | Qty: 60 | Fill #1 | Status: CN

## 2021-01-08 ENCOUNTER — Other Ambulatory Visit: Payer: Self-pay

## 2021-01-08 MED FILL — Metoprolol Tartrate Tab 50 MG: ORAL | 30 days supply | Qty: 60 | Fill #1 | Status: AC

## 2021-01-08 MED FILL — Pantoprazole Sodium EC Tab 40 MG (Base Equiv): ORAL | 30 days supply | Qty: 30 | Fill #1 | Status: AC

## 2021-01-18 ENCOUNTER — Other Ambulatory Visit: Payer: Self-pay

## 2021-01-18 MED FILL — Losartan Potassium Tab 50 MG: ORAL | 30 days supply | Qty: 30 | Fill #2 | Status: AC

## 2021-01-20 ENCOUNTER — Other Ambulatory Visit: Payer: Self-pay

## 2021-01-20 MED FILL — Omeprazole Cap Delayed Release 20 MG: ORAL | 30 days supply | Qty: 30 | Fill #0 | Status: CN

## 2021-01-21 ENCOUNTER — Other Ambulatory Visit: Payer: Self-pay

## 2021-01-27 ENCOUNTER — Other Ambulatory Visit: Payer: Self-pay

## 2021-02-08 ENCOUNTER — Other Ambulatory Visit: Payer: Self-pay

## 2021-02-08 MED FILL — Pantoprazole Sodium EC Tab 40 MG (Base Equiv): ORAL | 30 days supply | Qty: 30 | Fill #2 | Status: AC

## 2021-02-08 MED FILL — Metoprolol Tartrate Tab 50 MG: ORAL | 30 days supply | Qty: 60 | Fill #2 | Status: AC

## 2021-02-09 ENCOUNTER — Other Ambulatory Visit: Payer: Self-pay

## 2021-02-12 ENCOUNTER — Other Ambulatory Visit: Payer: Self-pay

## 2021-02-12 MED FILL — Losartan Potassium Tab 50 MG: ORAL | 30 days supply | Qty: 30 | Fill #3 | Status: CN

## 2021-02-19 ENCOUNTER — Other Ambulatory Visit: Payer: Self-pay

## 2021-02-19 MED FILL — Losartan Potassium Tab 50 MG: ORAL | 30 days supply | Qty: 30 | Fill #3 | Status: AC

## 2021-03-16 ENCOUNTER — Other Ambulatory Visit: Payer: Self-pay | Admitting: Family Medicine

## 2021-03-16 DIAGNOSIS — I10 Essential (primary) hypertension: Secondary | ICD-10-CM

## 2021-03-16 MED FILL — Metoprolol Tartrate Tab 50 MG: ORAL | 30 days supply | Qty: 60 | Fill #3 | Status: AC

## 2021-03-16 NOTE — Telephone Encounter (Signed)
Requested medication (s) are due for refill today: yes  Requested medication (s) are on the active medication list: yes  Last refill:  08/18/20  Future visit scheduled: yes  Notes to clinic:  overdue lab work- called pt and made appt   Requested Prescriptions  Pending Prescriptions Disp Refills   losartan (COZAAR) 50 MG tablet 30 tablet 6    Sig: TAKE 1 TABLET (50 MG TOTAL) BY MOUTH DAILY.     Cardiovascular:  Angiotensin Receptor Blockers Failed - 03/16/2021  4:34 PM      Failed - Cr in normal range and within 180 days    Creat  Date Value Ref Range Status  05/18/2016 0.87 0.60 - 1.35 mg/dL Final   Creatinine, Ser  Date Value Ref Range Status  05/18/2020 0.75 (L) 0.76 - 1.27 mg/dL Final          Failed - K in normal range and within 180 days    Potassium  Date Value Ref Range Status  05/18/2020 4.6 3.5 - 5.2 mmol/L Final          Failed - Last BP in normal range    BP Readings from Last 1 Encounters:  08/31/20 (!) 144/94          Failed - Valid encounter within last 6 months    Recent Outpatient Visits           7 months ago Hemorrhoids, unspecified hemorrhoid type   [place], [place], MD   10 months ago Essential hypertension   [place], [place], NP   1 year ago Hemorrhoids, unspecified hemorrhoid type   [place], [place]   1 year ago [place], [place], MD   1 year ago Other constipation   [place], [place], MD       Future Appointments             In 2 weeks [place], [place] [facility] - Patient is not pregnant

## 2021-03-17 ENCOUNTER — Other Ambulatory Visit: Payer: Self-pay

## 2021-03-19 ENCOUNTER — Other Ambulatory Visit: Payer: Self-pay

## 2021-03-19 ENCOUNTER — Telehealth: Payer: Self-pay | Admitting: Family Medicine

## 2021-03-19 DIAGNOSIS — I10 Essential (primary) hypertension: Secondary | ICD-10-CM

## 2021-03-19 NOTE — Telephone Encounter (Signed)
Pt came to the office to request a refill for Rx #: [id number]  losartan (COZAAR) 50 MG tablet [id number] , please send it to [facility] pharmacy

## 2021-03-22 ENCOUNTER — Other Ambulatory Visit: Payer: Self-pay

## 2021-03-22 MED ORDER — LOSARTAN POTASSIUM 50 MG PO TABS
50.0000 mg | ORAL_TABLET | Freq: Every day | ORAL | 0 refills | Status: DC
  Filled 2021-03-22: qty 30, 30d supply, fill #0

## 2021-03-22 NOTE — Telephone Encounter (Signed)
Rx sent to last until upcoming appointment.

## 2021-03-31 ENCOUNTER — Encounter: Payer: Self-pay | Admitting: Physician Assistant

## 2021-03-31 ENCOUNTER — Ambulatory Visit: Payer: 59 | Attending: Physician Assistant | Admitting: Physician Assistant

## 2021-03-31 ENCOUNTER — Other Ambulatory Visit: Payer: Self-pay

## 2021-03-31 VITALS — BP 148/96 | HR 96 | Resp 16 | Wt 200.6 lb

## 2021-03-31 DIAGNOSIS — R7303 Prediabetes: Secondary | ICD-10-CM

## 2021-03-31 DIAGNOSIS — K219 Gastro-esophageal reflux disease without esophagitis: Secondary | ICD-10-CM

## 2021-03-31 DIAGNOSIS — E785 Hyperlipidemia, unspecified: Secondary | ICD-10-CM

## 2021-03-31 DIAGNOSIS — I10 Essential (primary) hypertension: Secondary | ICD-10-CM | POA: Diagnosis not present

## 2021-03-31 DIAGNOSIS — J329 Chronic sinusitis, unspecified: Secondary | ICD-10-CM | POA: Diagnosis not present

## 2021-03-31 MED ORDER — CETIRIZINE HCL 10 MG PO TABS
10.0000 mg | ORAL_TABLET | Freq: Every day | ORAL | 3 refills | Status: DC
  Filled 2021-03-31: qty 30, 30d supply, fill #0

## 2021-03-31 MED ORDER — FLUTICASONE PROPIONATE 50 MCG/ACT NA SUSP
2.0000 | Freq: Every day | NASAL | 6 refills | Status: DC
Start: 2021-03-31 — End: 2022-04-04
  Filled 2021-03-31: qty 16, 30d supply, fill #0

## 2021-03-31 MED ORDER — OMEPRAZOLE 20 MG PO CPDR
DELAYED_RELEASE_CAPSULE | Freq: Every day | ORAL | 1 refills | Status: DC
  Filled 2021-03-31: qty 90, 90d supply, fill #0
  Filled 2021-05-18 – 2021-06-15 (×2): qty 90, 90d supply, fill #1

## 2021-03-31 MED ORDER — METOPROLOL TARTRATE 50 MG PO TABS
ORAL_TABLET | Freq: Two times a day (BID) | ORAL | 1 refills | Status: DC
  Filled 2021-03-31: qty 180, fill #0
  Filled 2021-04-19: qty 180, 90d supply, fill #0
  Filled 2021-07-13: qty 180, 90d supply, fill #1

## 2021-03-31 MED ORDER — LOSARTAN POTASSIUM 50 MG PO TABS
50.0000 mg | ORAL_TABLET | Freq: Every day | ORAL | 1 refills | Status: DC
  Filled 2021-03-31 – 2021-04-19 (×2): qty 90, 90d supply, fill #0
  Filled 2021-07-13: qty 90, 90d supply, fill #1

## 2021-03-31 NOTE — Progress Notes (Signed)
Timothy Sanchez, is a 55 y.o. male  [id number]  [id number]  DOB - [birth year]  Chief Complaint  Patient presents with   Medication Refill    Patient has no question or concerns.       Subjective:   Timothy Sanchez is a 54 y.o. male here today for med RF and allergies.  No issues or concerns.  He is completely out of losartan.  He has not eaten anything today.  He is wanting information on nursing homes for his elderly wife Timothy Sanchez.  Compliant with meds but out of losartan.  Last labs 04/2020   No problems updated.  ALLERGIES: No Known Allergies  PAST MEDICAL HISTORY: Past Medical History:  Diagnosis Date   Allergy    GERD (gastroesophageal reflux disease)    Hemorrhoids    Hyperlipidemia    Hypertension    Spinal stenosis of lumbar region    Vitamin D deficiency     MEDICATIONS AT HOME: Prior to Admission medications   Medication Sig Start Date End Date Taking? Authorizing Provider  Cholecalciferol (VITAMIN D3 PO) Take 1 tablet by mouth daily.   Yes [provider]  Cyanocobalamin (VITAMIN B-12 PO) Take 1 tablet by mouth daily.   Yes [provider]  methocarbamol (ROBAXIN) 500 MG tablet TAKE 1 TABLET (500 MG TOTAL) BY MOUTH 2 (TWO) TIMES DAILY AS NEEDED FOR MUSCLE SPASMS. 08/18/20 08/18/21 Yes Charlott Rakes, MD  cetirizine (ZYRTEC) 10 MG tablet Take 1 tablet (10 mg total) by mouth daily. 03/31/21   Argentina Donovan, PA-C  fluticasone (FLONASE) 50 MCG/ACT nasal spray Place 2 sprays into both nostrils daily. 03/31/21   Argentina Donovan, PA-C  losartan (COZAAR) 50 MG tablet Take 1 tablet (50 mg total) by mouth daily. 03/31/21 04/30/21  Argentina Donovan, PA-C  metoprolol tartrate (LOPRESSOR) 50 MG tablet TAKE 1 TABLET (50 MG TOTAL) BY MOUTH 2 (TWO) TIMES DAILY. 03/31/21 03/31/22  Argentina Donovan, PA-C  omeprazole (PRILOSEC) 20 MG capsule TAKE 1 CAPSULE (20 MG TOTAL) BY MOUTH DAILY. 03/31/21 03/31/22  Argentina Donovan, PA-C  atorvastatin (LIPITOR) 40 MG  tablet Take 1 tablet (40 mg total) by mouth daily. 05/18/20 08/18/20  Camillia Herter, NP    ROS: Neg HEENT Neg resp Neg cardiac Neg GI Neg GU Neg MS Neg psych Neg neuro  Objective:   Vitals:   03/31/21 1424  BP: (!) 148/96  Pulse: 96  Resp: 16  SpO2: 93%  Weight: 200 lb 9.6 oz (91 kg)   Exam General appearance : Awake, alert, not in any distress. Speech Clear. Not toxic looking HEENT: Atraumatic and Normocephalic Neck: Supple, no JVD. No cervical lymphadenopathy.  Chest: Good air entry bilaterally, CTAB.  No rales/rhonchi/wheezing CVS: S1 S2 regular, no murmurs.  Extremities: B/L Lower Ext shows no edema, both legs are warm to touch Neurology: Awake alert, and oriented X 3, CN II-XII intact, Non focal Skin: No Rash  Data Review Lab Results  Component Value Date   HGBA1C 5.9 (H) 05/18/2020   HGBA1C 5.7 (H) 09/03/2019   HGBA1C 5.60 09/15/2015    Assessment & Plan   1. Essential hypertension Resume losartan AND check BP OOO.  - metoprolol tartrate (LOPRESSOR) 50 MG tablet; TAKE 1 TABLET (50 MG TOTAL) BY MOUTH 2 (TWO) TIMES DAILY.  Dispense: 180 tablet; Refill: 1 - losartan (COZAAR) 50 MG tablet; Take 1 tablet (50 mg total) by mouth daily.  Dispense: 90 tablet; Refill: 1 - Comprehensive metabolic panel - CBC  with Differential/Platelet  2. Gastroesophageal reflux disease without esophagitis - omeprazole (PRILOSEC) 20 MG capsule; TAKE 1 CAPSULE (20 MG TOTAL) BY MOUTH DAILY.  Dispense: 90 capsule; Refill: 1  3. Sinus congestion related to allergies unspecified chronicity, unspecified location - fluticasone (FLONASE) 50 MCG/ACT nasal spray; Place 2 sprays into both nostrils daily.  Dispense: 16 g; Refill: 6 - cetirizine (ZYRTEC) 10 MG tablet; Take 1 tablet (10 mg total) by mouth daily.  Dispense: 30 tablet; Refill: 3  4. Hyperlipidemia, unspecified hyperlipidemia type - Comprehensive metabolic panel - Lipid panel - CBC with Differential/Platelet  5.  Prediabetes I have had a lengthy discussion and provided education about insulin resistance and the intake of too much sugar/refined carbohydrates.  I have advised the patient to work at a goal of eliminating sugary drinks, candy, desserts, sweets, refined sugars, processed foods, and white carbohydrates.  The patient expresses understanding.  - Comprehensive metabolic panel - Hemoglobin A1c - CBC with Differential/Platelet - Ambulatory referral to Social Work    Patient have been counseled extensively about nutrition and exercise. Other issues discussed during this visit include: low cholesterol diet, weight control and daily exercise, foot care, annual eye examinations at Ophthalmology, importance of adherence with medications and regular follow-up. We also discussed long term complications of uncontrolled diabetes and hypertension.   Return in about 6 months (around 09/28/2021) for PCP;  chronic conditions.  The patient was given clear instructions to go to ER or return to medical center if symptoms don't improve, worsen or new problems develop. The patient verbalized understanding. The patient was told to call to get lab results if they haven't heard anything in the next week.      Timothy Caldron, PA-C [facility] and [facility] [place], [place]   03/31/2021, 2:52 PM Patient ID: Timothy Sanchez, male   DOB: 04-20-1966, 55 y.o.   MRN: [id number]

## 2021-04-01 ENCOUNTER — Other Ambulatory Visit: Payer: Self-pay | Admitting: Physician Assistant

## 2021-04-01 ENCOUNTER — Other Ambulatory Visit: Payer: Self-pay

## 2021-04-01 DIAGNOSIS — E785 Hyperlipidemia, unspecified: Secondary | ICD-10-CM

## 2021-04-01 DIAGNOSIS — R7303 Prediabetes: Secondary | ICD-10-CM

## 2021-04-01 LAB — CBC WITH DIFFERENTIAL/PLATELET
Basophils Absolute: 0.1 10*3/uL (ref 0.0–0.2)
Basos: 1 %
EOS (ABSOLUTE): 0.2 10*3/uL (ref 0.0–0.4)
Eos: 3 %
Hematocrit: 47.4 % (ref 37.5–51.0)
Hemoglobin: 15.8 g/dL (ref 13.0–17.7)
Immature Grans (Abs): 0.1 10*3/uL (ref 0.0–0.1)
Immature Granulocytes: 1 %
Lymphocytes Absolute: 1.4 10*3/uL (ref 0.7–3.1)
Lymphs: 25 %
MCH: 28.8 pg (ref 26.6–33.0)
MCHC: 33.3 g/dL (ref 31.5–35.7)
MCV: 87 fL (ref 79–97)
Monocytes Absolute: 0.5 10*3/uL (ref 0.1–0.9)
Monocytes: 9 %
Neutrophils Absolute: 3.5 10*3/uL (ref 1.4–7.0)
Neutrophils: 61 %
Platelets: 195 10*3/uL (ref 150–450)
RBC: 5.48 x10E6/uL (ref 4.14–5.80)
RDW: 14.4 % (ref 11.6–15.4)
WBC: 5.7 10*3/uL (ref 3.4–10.8)

## 2021-04-01 LAB — LIPID PANEL
Chol/HDL Ratio: 4.1 ratio (ref 0.0–5.0)
Cholesterol, Total: 230 mg/dL — ABNORMAL HIGH (ref 100–199)
HDL: 56 mg/dL (ref >39)
LDL Chol Calc (NIH): 126 mg/dL — ABNORMAL HIGH (ref 0–99)
Triglycerides: 272 mg/dL — ABNORMAL HIGH (ref 0–149)
VLDL Cholesterol Cal: 48 mg/dL — ABNORMAL HIGH (ref 5–40)

## 2021-04-01 LAB — COMPREHENSIVE METABOLIC PANEL
ALT: 21 IU/L (ref 0–44)
AST: 27 IU/L (ref 0–40)
Albumin/Globulin Ratio: 1.7 (ref 1.2–2.2)
Albumin: 3.9 g/dL (ref 3.8–4.9)
Alkaline Phosphatase: 74 IU/L (ref 44–121)
BUN/Creatinine Ratio: 17 (ref 9–20)
BUN: 13 mg/dL (ref 6–24)
Bilirubin Total: 0.3 mg/dL (ref 0.0–1.2)
CO2: 24 mmol/L (ref 20–29)
Calcium: 8.9 mg/dL (ref 8.7–10.2)
Chloride: 99 mmol/L (ref 96–106)
Creatinine, Ser: 0.75 mg/dL — ABNORMAL LOW (ref 0.76–1.27)
Globulin, Total: 2.3 g/dL (ref 1.5–4.5)
Glucose: 95 mg/dL (ref 65–99)
Potassium: 4.5 mmol/L (ref 3.5–5.2)
Sodium: 137 mmol/L (ref 134–144)
Total Protein: 6.2 g/dL (ref 6.0–8.5)
eGFR: 107 mL/min/{1.73_m2} (ref >59)

## 2021-04-01 LAB — HEMOGLOBIN A1C
Est. average glucose Bld gHb Est-mCnc: 128 mg/dL
Hgb A1c MFr Bld: 6.1 % — ABNORMAL HIGH (ref 4.8–5.6)

## 2021-04-01 MED ORDER — ATORVASTATIN CALCIUM 20 MG PO TABS
20.0000 mg | ORAL_TABLET | Freq: Every day | ORAL | 3 refills | Status: DC
  Filled 2021-04-01: qty 90, 90d supply, fill #0

## 2021-04-01 MED ORDER — METFORMIN HCL 500 MG PO TABS
500.0000 mg | ORAL_TABLET | Freq: Two times a day (BID) | ORAL | 3 refills | Status: DC
  Filled 2021-04-01: qty 180, 90d supply, fill #0

## 2021-04-08 ENCOUNTER — Other Ambulatory Visit: Payer: Self-pay

## 2021-04-19 ENCOUNTER — Other Ambulatory Visit: Payer: Self-pay

## 2021-05-18 ENCOUNTER — Other Ambulatory Visit: Payer: Self-pay

## 2021-05-19 ENCOUNTER — Other Ambulatory Visit: Payer: Self-pay

## 2021-06-15 ENCOUNTER — Other Ambulatory Visit: Payer: Self-pay

## 2021-07-13 ENCOUNTER — Other Ambulatory Visit: Payer: Self-pay

## 2021-07-14 ENCOUNTER — Other Ambulatory Visit: Payer: Self-pay

## 2021-09-28 ENCOUNTER — Other Ambulatory Visit: Payer: Self-pay

## 2021-09-28 ENCOUNTER — Ambulatory Visit: Payer: 59 | Attending: Family Medicine | Admitting: Family Medicine

## 2021-09-28 ENCOUNTER — Encounter: Payer: Self-pay | Admitting: Family Medicine

## 2021-09-28 VITALS — BP 135/87 | HR 95 | Ht 67.0 in | Wt 192.8 lb

## 2021-09-28 DIAGNOSIS — K649 Unspecified hemorrhoids: Secondary | ICD-10-CM

## 2021-09-28 DIAGNOSIS — I10 Essential (primary) hypertension: Secondary | ICD-10-CM | POA: Diagnosis not present

## 2021-09-28 DIAGNOSIS — N528 Other male erectile dysfunction: Secondary | ICD-10-CM

## 2021-09-28 DIAGNOSIS — F17210 Nicotine dependence, cigarettes, uncomplicated: Secondary | ICD-10-CM | POA: Diagnosis not present

## 2021-09-28 DIAGNOSIS — Z1159 Encounter for screening for other viral diseases: Secondary | ICD-10-CM

## 2021-09-28 DIAGNOSIS — R7303 Prediabetes: Secondary | ICD-10-CM

## 2021-09-28 LAB — POCT GLYCOSYLATED HEMOGLOBIN (HGB A1C): HbA1c, POC (prediabetic range): 5.6 % — AB (ref 5.7–6.4)

## 2021-09-28 MED ORDER — SILDENAFIL CITRATE 50 MG PO TABS
50.0000 mg | ORAL_TABLET | Freq: Every day | ORAL | 1 refills | Status: DC | PRN
  Filled 2021-09-28: qty 10, 20d supply, fill #0

## 2021-09-28 NOTE — Progress Notes (Signed)
Discuss erectile dysfunction. ?Need medication refills ?

## 2021-09-28 NOTE — Patient Instructions (Signed)
Erectile Dysfunction °Erectile dysfunction (ED) is the inability to get or keep an erection in order to have sexual intercourse. ED is considered a symptom of an underlying disorder and is not considered a disease. ED may include: °Inability to get an erection. °Lack of enough hardness of the erection to allow penetration. °Loss of erection before sex is finished. °What are the causes? °This condition may be caused by: °Physical causes, such as: °Artery problems. This may include heart disease, high blood pressure, atherosclerosis, and diabetes. °Hormonal problems, such as low testosterone. °Obesity. °Nerve problems. This may include back or pelvic injuries, multiple sclerosis, Parkinson's disease, spinal cord injury, and stroke. °Certain medicines, such as: °Pain relievers. °Antidepressants. °Blood pressure medicines and water pills (diuretics). °Cancer medicines. °Antihistamines. °Muscle relaxants. °Lifestyle factors, such as: °Use of drugs such as marijuana, cocaine, or opioids. °Excessive use of alcohol. °Smoking. °Lack of physical activity or exercise. °Psychological causes, such as: °Anxiety or stress. °Sadness or depression. °Exhaustion. °Fear about sexual performance. °Guilt. °What are the signs or symptoms? °Symptoms of this condition include: °Inability to get an erection. °Lack of enough hardness of the erection to allow penetration. °Loss of the erection before sex is finished. °Sometimes having normal erections, but with frequent unsatisfactory episodes. °Low sexual satisfaction in either partner due to erection problems. °A curved penis occurring with erection. The curve may cause pain, or the penis may be too curved to allow for intercourse. °Never having nighttime or morning erections. °How is this diagnosed? °This condition is often diagnosed by: °Performing a physical exam to find other diseases or specific problems with the penis. °Asking you detailed questions about the problem. °Doing tests,  such as: °Blood tests to check for diabetes mellitus or high cholesterol, or to measure hormone levels. °Other tests to check for underlying health conditions. °An ultrasound exam to check for scarring. °A test to check blood flow to the penis. °Doing a sleep study at home to measure nighttime erections. °How is this treated? °This condition may be treated by: °Medicines, such as: °Medicine taken by mouth to help you achieve an erection (oral medicine). °Hormone replacement therapy to replace low testosterone levels. °Medicine that is injected into the penis. Your health care provider may instruct you how to give yourself these injections at home. °Medicine that is delivered with a short applicator tube. The tube is inserted into the opening at the tip of the penis, which is the opening of the urethra. A tiny pellet of medicine is put in the urethra. The pellet dissolves and enhances erectile function. This is also called MUSE (medicated urethral system for erections) therapy. °Vacuum pump. This is a pump with a ring on it. The pump and ring are placed on the penis and used to create pressure that helps the penis become erect. °Penile implant surgery. In this procedure, you may receive: °An inflatable implant. This consists of cylinders, a pump, and a reservoir. The cylinders can be inflated with a fluid that helps to create an erection, and they can be deflated after intercourse. °A semi-rigid implant. This consists of two silicone rubber rods. The rods provide some rigidity. They are also flexible, so the penis can both curve downward in its normal position and become straight for sexual intercourse. °Blood vessel surgery to improve blood flow to the penis. During this procedure, a blood vessel from a different part of the body is placed into the penis to allow blood to flow around (bypass) damaged or blocked blood vessels. °Lifestyle changes,   such as exercising more, losing weight, and quitting smoking. °Follow  these instructions at home: °Medicines ° °Take over-the-counter and prescription medicines only as told by your health care provider. Do not increase the dosage without first discussing it with your health care provider. °If you are using self-injections, do injections as directed by your health care provider. Make sure you avoid any veins that are on the surface of the penis. After giving an injection, apply pressure to the injection site for 5 minutes. °Talk to your health care provider about how to prevent headaches while taking ED medicines. These medicines may cause a sudden headache due to the increase in blood flow in your body. °General instructions °Exercise regularly, as directed by your health care provider. Work with your health care provider to lose weight, if needed. °Do not use any products that contain nicotine or tobacco. These products include cigarettes, chewing tobacco, and vaping devices, such as e-cigarettes. If you need help quitting, ask your health care provider. °Before using a vacuum pump, read the instructions that come with the pump and discuss any questions with your health care provider. °Keep all follow-up visits. This is important. °Contact a health care provider if: °You feel nauseous. °You are vomiting. °You get sudden headaches while taking ED medicines. °You have any concerns about your sexual health. °Get help right away if: °You are taking oral or injectable medicines and you have an erection that lasts longer than 4 hours. If your health care provider is unavailable, go to the nearest emergency room for evaluation. An erection that lasts much longer than 4 hours can result in permanent damage to your penis. °You have severe pain in your groin or abdomen. °You develop redness or severe swelling of your penis. °You have redness spreading at your groin or lower abdomen. °You are unable to urinate. °You experience chest pain or a rapid heartbeat (palpitations) after taking oral  medicines. °These symptoms may represent a serious problem that is an emergency. Do not wait to see if the symptoms will go away. Get medical help right away. Call your local emergency services (911 in the [place] Do not drive yourself to the hospital. °Summary °Erectile dysfunction (ED) is the inability to get or keep an erection during sexual intercourse. °This condition is diagnosed based on a physical exam, your symptoms, and tests to determine the cause. Treatment varies depending on the cause and may include medicines, hormone therapy, surgery, or a vacuum pump. °You may need follow-up visits to make sure that you are using your medicines or devices correctly. °Get help right away if you are taking or injecting medicines and you have an erection that lasts longer than 4 hours. °This information is not intended to replace advice given to you by your health care provider. Make sure you discuss any questions you have with your health care provider. °Document Revised: 10/07/2020 Document Reviewed: 10/07/2020 °Elsevier Patient Education © 2022 [employer]. ° °

## 2021-09-28 NOTE — Progress Notes (Signed)
? ?Subjective:  ?Patient ID: Timothy Sanchez, male    DOB: 10-18-1965  Age: 56 y.o. MRN: [id number] ? ?CC: Hypertension ? ? ?HPI ?Timothy Sanchez is a 56 y.o. 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 ? ?Interval History: ?Smokes 1 ppd and has smoked since he was in high school and is planning to work on quitting when Ramadan starts in 2 weeks. ?Compliant with his antihypertensive and statin.  Reflux symptoms are controlled. ?He is requesting a prescription for erectile dysfunction. ?Denies presence of chest pain, dyspnea. ?He would like a referral to general surgery for evaluation of hemorrhoids as OTC hemorrhoidal cream and sitz bath have failed. ?Past Medical History:  ?Diagnosis Date  ? Allergy   ? GERD (gastroesophageal reflux disease)   ? Hemorrhoids   ? Hyperlipidemia   ? Hypertension   ? Spinal stenosis of lumbar region   ? Vitamin D deficiency   ? ? ?Past Surgical History:  ?Procedure Laterality Date  ? NO PAST SURGERIES    ? ? ?Family History  ?Problem Relation Age of Onset  ? Diabetes Mother   ? Hypertension Mother   ? Colon cancer Neg Hx   ? Esophageal cancer Neg Hx   ? Rectal cancer Neg Hx   ? Stomach cancer Neg Hx   ? ? ?No Known Allergies ? ?Outpatient Medications Prior to Visit  ?Medication Sig Dispense Refill  ? atorvastatin (LIPITOR) 20 MG tablet Take 1 tablet (20 mg total) by mouth daily. 90 tablet 3  ? cetirizine (ZYRTEC) 10 MG tablet Take 1 tablet (10 mg total) by mouth daily. 30 tablet 3  ? Cholecalciferol (VITAMIN D3 PO) Take 1 tablet by mouth daily.    ? Cyanocobalamin (VITAMIN B-12 PO) Take 1 tablet by mouth daily.    ? fluticasone (FLONASE) 50 MCG/ACT nasal spray Place 2 sprays into both nostrils daily. 16 g 6  ? losartan (COZAAR) 50 MG tablet Take 1 tablet (50 mg total) by mouth daily. 90 tablet 1  ? metFORMIN (GLUCOPHAGE) 500 MG tablet Take 1 tablet (500 mg total) by mouth 2  (two) times daily with a meal. 180 tablet 3  ? metoprolol tartrate (LOPRESSOR) 50 MG tablet TAKE 1 TABLET (50 MG TOTAL) BY MOUTH 2 (TWO) TIMES DAILY. 180 tablet 1  ? omeprazole (PRILOSEC) 20 MG capsule TAKE 1 CAPSULE (20 MG TOTAL) BY MOUTH DAILY. 90 capsule 1  ? ?No facility-administered medications prior to visit.  ? ? ? ?ROS ?Review of Systems  ?Constitutional:  Negative for activity change and appetite change.  ?HENT:  Negative for sinus pressure and sore throat.   ?Eyes:  Negative for visual disturbance.  ?Respiratory:  Negative for cough, chest tightness and shortness of breath.   ?Cardiovascular:  Negative for chest pain and leg swelling.  ?Gastrointestinal:  Negative for abdominal distention, abdominal pain, constipation and diarrhea.  ?Endocrine: Negative.   ?Genitourinary:  Negative for dysuria.  ?Musculoskeletal:  Negative for joint swelling and myalgias.  ?Skin:  Negative for rash.  ?Allergic/Immunologic: Negative.   ?Neurological:  Negative for weakness, light-headedness and numbness.  ?Psychiatric/Behavioral:  Negative for dysphoric mood and suicidal ideas.   ? ?Objective:  ?BP 135/87   Pulse 95   Ht '5\' 7"'$  (1.702 m)   Wt 192 lb 12.8 oz (87.5 kg)   SpO2 97%   BMI 30.20 kg/m?  ? ?BP/Weight 09/28/2021 03/31/2021 08/31/2020  ?Systolic BP  135 148 144  ?Diastolic BP 87 96 94  ?Wt. (Lbs) 192.8 200.6 204  ?BMI 30.2 27.98 28.45  ? ? ? ? ?Physical Exam ?Constitutional:   ?   Appearance: He is well-developed.  ?Cardiovascular:  ?   Rate and Rhythm: Normal rate.  ?   Heart sounds: Normal heart sounds. No murmur heard. ?Pulmonary:  ?   Effort: Pulmonary effort is normal.  ?   Breath sounds: Normal breath sounds. No wheezing or rales.  ?Chest:  ?   Chest wall: No tenderness.  ?Abdominal:  ?   General: Bowel sounds are normal. There is no distension.  ?   Palpations: Abdomen is soft. There is no mass.  ?   Tenderness: There is no abdominal tenderness.  ?Musculoskeletal:     ?   General: Normal range of motion.  ?    Right lower leg: No edema.  ?   Left lower leg: No edema.  ?Neurological:  ?   Mental Status: He is alert and oriented to person, place, and time.  ?Psychiatric:     ?   Mood and Affect: Mood normal.  ? ? ?CMP Latest Ref Rng & Units 03/31/2021 05/18/2020 09/03/2019  ?Glucose 65 - 99 mg/dL 95 98 101(H)  ?BUN 6 - 24 mg/dL '13 20 17  '$ ?Creatinine 0.76 - 1.27 mg/dL 0.75(L) 0.75(L) 0.91  ?Sodium 134 - 144 mmol/L 137 138 139  ?Potassium 3.5 - 5.2 mmol/L 4.5 4.6 4.2  ?Chloride 96 - 106 mmol/L 99 101 99  ?CO2 20 - 29 mmol/L '24 24 25  '$ ?Calcium 8.7 - 10.2 mg/dL 8.9 9.2 9.4  ?Total Protein 6.0 - 8.5 g/dL 6.2 - 6.6  ?Total Bilirubin 0.0 - 1.2 mg/dL 0.3 - 0.5  ?Alkaline Phos 44 - 121 IU/L 74 - 71  ?AST 0 - 40 IU/L 27 - 34  ?ALT 0 - 44 IU/L 21 - 32  ? ? ?Lipid Panel  ?   ?Component Value Date/Time  ? CHOL 230 (H) 03/31/2021 1447  ? TRIG 272 (H) 03/31/2021 1447  ? HDL 56 03/31/2021 1447  ? CHOLHDL 4.1 03/31/2021 1447  ? CHOLHDL 2.0 05/18/2016 1511  ? VLDL 16 05/18/2016 1511  ? LDLCALC 126 (H) 03/31/2021 1447  ? ? ?CBC ?   ?Component Value Date/Time  ? WBC 5.7 03/31/2021 1447  ? WBC 5.5 01/28/2018 1841  ? RBC 5.48 03/31/2021 1447  ? RBC 5.50 01/28/2018 1841  ? HGB 15.8 03/31/2021 1447  ? HCT 47.4 03/31/2021 1447  ? PLT 195 03/31/2021 1447  ? MCV 87 03/31/2021 1447  ? MCH 28.8 03/31/2021 1447  ? MCH 27.1 01/28/2018 1841  ? MCHC 33.3 03/31/2021 1447  ? MCHC 31.5 01/28/2018 1841  ? RDW 14.4 03/31/2021 1447  ? LYMPHSABS 1.4 03/31/2021 1447  ? MONOABS 0.5 01/28/2018 1841  ? EOSABS 0.2 03/31/2021 1447  ? BASOSABS 0.1 03/31/2021 1447  ? ? ?Lab Results  ?Component Value Date  ? HGBA1C 6.1 (H) 03/31/2021  ? ? ?Assessment & Plan:  ?1. Prediabetes ?Controlled with A1c of 6.1 ?Continue metformin ?Work on lifestyle modifications to prevent progression to type 2 diabetes mellitus ?- POCT glycosylated hemoglobin (Hb A1C) ?- LP+Non-HDL Cholesterol ?- Hemoglobin A1c ? ?2. Essential hypertension ?Controlled ?Continue current antihypertensive ?Counseled on  blood pressure goal of less than 130/80, low-sodium, DASH diet, medication compliance, 150 minutes of moderate intensity exercise per week. ?Discussed medication compliance, adverse effects. ? ? ?3. Other male erectile dysfunction ?- sildenafil (VIAGRA) 50 MG tablet;  Take 1 tablet (50 mg total) by mouth daily as needed for erectile dysfunction.  Dispense: 10 tablet; Refill: 1 ? ?4. Smoking greater than 20 pack years ?Spent 3 minutes counseling on smoking cessation and he is willing to work on quitting when milligrams started in 2 weeks ?- CT CHEST LUNG CANCER SCREENING LOW DOSE WO CONTRAST; Future ? ?5. Screening for viral disease ?- HCV Ab w Reflex to Quant PCR ?- Interpretation: ? ?6. Hemorrhoids, unspecified hemorrhoid type ?Conservative measures have failed ?- Ambulatory referral to General Surgery ? ?Health Care Maintenance: Up-to-date on colonoscopy ?No orders of the defined types were placed in this encounter. ? ? ?Return in about 6 months (around 03/31/2022) for Chronic medical conditions. ? ? ? ? ? ? ?Charlott Rakes, MD, FAAFP. ?[place] ?[place], [place] ?[phone]   ?09/28/2021, 2:29 PM ?

## 2021-09-29 ENCOUNTER — Encounter: Payer: Self-pay | Admitting: Family Medicine

## 2021-09-29 LAB — LP+NON-HDL CHOLESTEROL
Cholesterol, Total: 205 mg/dL — ABNORMAL HIGH (ref 100–199)
HDL: 61 mg/dL (ref >39)
LDL Chol Calc (NIH): 123 mg/dL — ABNORMAL HIGH (ref 0–99)
Total Non-HDL-Chol (LDL+VLDL): 144 mg/dL — ABNORMAL HIGH (ref 0–129)
Triglycerides: 120 mg/dL (ref 0–149)
VLDL Cholesterol Cal: 21 mg/dL (ref 5–40)

## 2021-09-29 LAB — HCV AB W REFLEX TO QUANT PCR: HCV Ab: NONREACTIVE

## 2021-09-29 LAB — HEMOGLOBIN A1C
Est. average glucose Bld gHb Est-mCnc: 114 mg/dL
Hgb A1c MFr Bld: 5.6 % (ref 4.8–5.6)

## 2021-09-29 LAB — HCV INTERPRETATION

## 2021-10-04 ENCOUNTER — Ambulatory Visit (HOSPITAL_BASED_OUTPATIENT_CLINIC_OR_DEPARTMENT_OTHER): Payer: 59

## 2021-10-07 ENCOUNTER — Ambulatory Visit (HOSPITAL_BASED_OUTPATIENT_CLINIC_OR_DEPARTMENT_OTHER): Admission: RE | Admit: 2021-10-07 | Payer: 59 | Source: Ambulatory Visit

## 2021-10-13 ENCOUNTER — Ambulatory Visit (HOSPITAL_BASED_OUTPATIENT_CLINIC_OR_DEPARTMENT_OTHER): Payer: 59 | Attending: Family Medicine

## 2021-10-14 ENCOUNTER — Other Ambulatory Visit: Payer: Self-pay | Admitting: Physician Assistant

## 2021-10-14 DIAGNOSIS — I10 Essential (primary) hypertension: Secondary | ICD-10-CM

## 2021-10-14 MED ORDER — METOPROLOL TARTRATE 50 MG PO TABS
ORAL_TABLET | Freq: Two times a day (BID) | ORAL | 1 refills | Status: DC
  Filled 2021-10-14: qty 180, fill #0
  Filled 2021-10-14: qty 180, 90d supply, fill #0
  Filled 2022-01-10: qty 180, 90d supply, fill #1

## 2021-10-14 MED ORDER — LOSARTAN POTASSIUM 50 MG PO TABS
50.0000 mg | ORAL_TABLET | Freq: Every day | ORAL | 1 refills | Status: DC
  Filled 2021-10-14 (×2): qty 90, 90d supply, fill #0
  Filled 2022-01-10: qty 90, 90d supply, fill #1

## 2021-10-14 NOTE — Telephone Encounter (Signed)
Requested Prescriptions  ?Pending Prescriptions Disp Refills  ?? losartan (COZAAR) 50 MG tablet 90 tablet 1  ?  Sig: Take 1 tablet (50 mg total) by mouth daily.  ?  ? Cardiovascular:  Angiotensin Receptor Blockers Failed - 10/14/2021  4:54 PM  ?  ?  Failed - Cr in normal range and within 180 days  ?  Creat  ?Date Value Ref Range Status  ?05/18/2016 0.87 0.60 - 1.35 mg/dL Final  ? ?Creatinine, Ser  ?Date Value Ref Range Status  ?03/31/2021 0.75 (L) 0.76 - 1.27 mg/dL Final  ?   ?  ?  Failed - K in normal range and within 180 days  ?  Potassium  ?Date Value Ref Range Status  ?03/31/2021 4.5 3.5 - 5.2 mmol/L Final  ?   ?  ?  Passed - Patient is not pregnant  ?  ?  Passed - Last BP in normal range  ?  BP Readings from Last 1 Encounters:  ?09/28/21 135/87  ?   ?  ?  Passed - Valid encounter within last 6 months  ?  Recent Outpatient Visits   ?      ? 2 weeks ago Prediabetes  ? [place], Charlane Ferretti, MD  ? 6 months ago Hyperlipidemia, unspecified hyperlipidemia type  ? [place], [place], [place]  ? 1 year ago Hemorrhoids, unspecified hemorrhoid type  ? [place] Charlott Rakes, MD  ? 1 year ago Essential hypertension  ? [place], [place] J, NP  ? 1 year ago Hemorrhoids, unspecified hemorrhoid type  ? [place], [place], [place]  ?  ?  ?Future Appointments   ?        ? In 5 months Charlott Rakes, MD [place]  ?  ? ?  ?  ?  ?? metoprolol tartrate (LOPRESSOR) 50 MG tablet 180 tablet 1  ?  Sig: TAKE 1 TABLET (50 MG TOTAL) BY MOUTH 2 (TWO) TIMES DAILY.  ?  ? Cardiovascular:  Beta Blockers Passed - 10/14/2021  4:54 PM  ?  ?  Passed - Last BP in normal range  ?  BP Readings from Last 1 Encounters:  ?09/28/21 135/87  ?   ?  ?  Passed - Last Heart Rate in normal range  ?  Pulse Readings from Last 1 Encounters:   ?09/28/21 95  ?   ?  ?  Passed - Valid encounter within last 6 months  ?  Recent Outpatient Visits   ?      ? 2 weeks ago Prediabetes  ? [place], Charlane Ferretti, MD  ? 6 months ago Hyperlipidemia, unspecified hyperlipidemia type  ? [place], [place], [place]  ? 1 year ago Hemorrhoids, unspecified hemorrhoid type  ? [place] Charlott Rakes, MD  ? 1 year ago Essential hypertension  ? [place], [place] J, NP  ? 1 year ago Hemorrhoids, unspecified hemorrhoid type  ? [place], [place], [place]  ?  ?  ?Future Appointments   ?        ? In 5 months Charlott Rakes, MD [place]  ?  ? ?  ?  ?  ? ? ?

## 2021-10-15 ENCOUNTER — Other Ambulatory Visit: Payer: Self-pay

## 2021-10-18 ENCOUNTER — Other Ambulatory Visit: Payer: Self-pay

## 2022-01-10 ENCOUNTER — Other Ambulatory Visit: Payer: Self-pay

## 2022-01-14 ENCOUNTER — Other Ambulatory Visit: Payer: Self-pay

## 2022-03-30 ENCOUNTER — Ambulatory Visit: Payer: 59 | Admitting: Family Medicine

## 2022-04-04 ENCOUNTER — Ambulatory Visit: Payer: 59 | Attending: Family Medicine | Admitting: Family Medicine

## 2022-04-04 ENCOUNTER — Other Ambulatory Visit: Payer: Self-pay

## 2022-04-04 ENCOUNTER — Encounter: Payer: Self-pay | Admitting: Family Medicine

## 2022-04-04 VITALS — BP 121/78 | HR 87 | Temp 98.0°F | Ht 72.25 in | Wt 202.2 lb

## 2022-04-04 DIAGNOSIS — F17210 Nicotine dependence, cigarettes, uncomplicated: Secondary | ICD-10-CM

## 2022-04-04 DIAGNOSIS — R7303 Prediabetes: Secondary | ICD-10-CM

## 2022-04-04 DIAGNOSIS — E785 Hyperlipidemia, unspecified: Secondary | ICD-10-CM

## 2022-04-04 DIAGNOSIS — I10 Essential (primary) hypertension: Secondary | ICD-10-CM

## 2022-04-04 DIAGNOSIS — J329 Chronic sinusitis, unspecified: Secondary | ICD-10-CM

## 2022-04-04 DIAGNOSIS — Z1321 Encounter for screening for nutritional disorder: Secondary | ICD-10-CM

## 2022-04-04 MED ORDER — LOSARTAN POTASSIUM 50 MG PO TABS
50.0000 mg | ORAL_TABLET | Freq: Every day | ORAL | 1 refills | Status: DC
  Filled 2022-04-04 – 2022-04-15 (×2): qty 30, 30d supply, fill #0
  Filled 2022-05-10: qty 30, 30d supply, fill #1
  Filled 2022-05-31: qty 30, 30d supply, fill #2
  Filled 2022-06-30: qty 30, 30d supply, fill #3
  Filled 2022-07-28 – 2022-07-29 (×2): qty 30, 30d supply, fill #4
  Filled 2022-08-27: qty 30, 30d supply, fill #5

## 2022-04-04 MED ORDER — ATORVASTATIN CALCIUM 20 MG PO TABS
20.0000 mg | ORAL_TABLET | Freq: Every day | ORAL | 3 refills | Status: DC
  Filled 2022-04-04 – 2022-04-15 (×2): qty 30, 30d supply, fill #0

## 2022-04-04 MED ORDER — FLUTICASONE PROPIONATE 50 MCG/ACT NA SUSP
2.0000 | Freq: Every day | NASAL | 6 refills | Status: DC
  Filled 2022-04-04: qty 16, 30d supply, fill #0

## 2022-04-04 MED ORDER — METOPROLOL TARTRATE 50 MG PO TABS
ORAL_TABLET | Freq: Two times a day (BID) | ORAL | 1 refills | Status: DC
  Filled 2022-04-04 – 2022-04-15 (×2): qty 60, 30d supply, fill #0
  Filled 2022-05-31: qty 60, 30d supply, fill #1
  Filled 2022-06-30: qty 60, 30d supply, fill #2
  Filled 2022-07-28 – 2022-08-01 (×3): qty 60, 30d supply, fill #3
  Filled 2022-08-27: qty 60, 30d supply, fill #4
  Filled 2022-09-28: qty 60, 30d supply, fill #5

## 2022-04-04 NOTE — Progress Notes (Signed)
Subjective:  Patient ID: Timothy Sanchez, male    DOB: Nov 03, 1965  Age: 56 y.o. MRN: [id number]  CC: Hypertension   HPI Timothy Sanchez is a 56 y.o. 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  Interval History:  His back has not been hurting and he is not needing anything for pain.  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 Endorses adherence with his antihypertensive but not with his statin.  He was also placed on metformin for prediabetes but apparently has not been taking it. Not ready to quit smoking.  Low-dose chest CT was ordered previously for lung cancer screening however he never followed through with this due to the timing which was inconvenient for him.  States he would prefer an afternoon appointment. He takes Vitamin b12 and vitamin D supplements OTC.  Needs a refill of his Flonase for chronic sinusitis. Denies additional concerns today.  Past Medical History:  Diagnosis Date   Allergy    GERD (gastroesophageal reflux disease)    Hemorrhoids    Hyperlipidemia    Hypertension    Spinal stenosis of lumbar region    Vitamin D deficiency     Past Surgical History:  Procedure Laterality Date   NO PAST SURGERIES      Family History  Problem Relation Age of Onset   Diabetes Mother    Hypertension Mother    Colon cancer Neg Hx    Esophageal cancer Neg Hx    Rectal cancer Neg Hx    Stomach cancer Neg Hx     Social History   Socioeconomic History   Marital status: Married    Spouse name: Not on file   Number of children: 0   Years of education: Not on file   Highest education level: Not on file  Occupational History   Occupation: Delivery driver  Tobacco Use   Smoking status: Heavy Smoker    Packs/day: 1.50    Years: 25.00    Total pack years: 37.50    Types: Cigarettes    Start date: 03/26/2013   Smokeless tobacco: Never  Vaping Use    Vaping Use: Never used  Substance and Sexual Activity   Alcohol use: Yes    Alcohol/week: 8.0 standard drinks of alcohol    Types: 1 Cans of beer, 7 Shots of liquor per week    Comment: daily   Drug use: No   Sexual activity: Not on file  Other Topics Concern   Not on file  Social History Narrative   ** Merged History Encounter **       Social Determinants of Health   Financial Resource Strain: Not on file  Food Insecurity: Not on file  Transportation Needs: Not on file  Physical Activity: Not on file  Stress: Not on file  Social Connections: Not on file    No Known Allergies  Outpatient Medications Prior to Visit  Medication Sig Dispense Refill   metFORMIN (GLUCOPHAGE) 500 MG tablet Take 1 tablet (500 mg total) by mouth 2 (two) times daily with a meal. 180 tablet 3   sildenafil (VIAGRA) 50 MG tablet Take 1 tablet (50 mg total) by mouth daily as needed for erectile dysfunction. 10 tablet 1   fluticasone (FLONASE) 50 MCG/ACT nasal spray Place 2 sprays into both nostrils daily. 16 g 6   losartan (COZAAR) 50 MG tablet Take 1 tablet (50 mg  total) by mouth daily. 90 tablet 1   metoprolol tartrate (LOPRESSOR) 50 MG tablet TAKE 1 TABLET (50 MG TOTAL) BY MOUTH 2 (TWO) TIMES DAILY. 180 tablet 1   cetirizine (ZYRTEC) 10 MG tablet Take 1 tablet (10 mg total) by mouth daily. (Patient not taking: Reported on 04/04/2022) 30 tablet 3   Cholecalciferol (VITAMIN D3 PO) Take 1 tablet by mouth daily. (Patient not taking: Reported on 04/04/2022)     Cyanocobalamin (VITAMIN B-12 PO) Take 1 tablet by mouth daily. (Patient not taking: Reported on 04/04/2022)     omeprazole (PRILOSEC) 20 MG capsule TAKE 1 CAPSULE (20 MG TOTAL) BY MOUTH DAILY. 90 capsule 1   atorvastatin (LIPITOR) 20 MG tablet Take 1 tablet (20 mg total) by mouth daily. (Patient not taking: Reported on 04/04/2022) 90 tablet 3   No facility-administered medications prior to visit.     ROS Review of Systems  Constitutional:  Negative  for activity change and appetite change.  HENT:  Negative for sinus pressure and sore throat.   Respiratory:  Negative for chest tightness, shortness of breath and wheezing.   Cardiovascular:  Negative for chest pain and palpitations.  Gastrointestinal:  Negative for abdominal distention, abdominal pain and constipation.  Genitourinary: Negative.   Musculoskeletal: Negative.   Psychiatric/Behavioral:  Negative for behavioral problems and dysphoric mood.     Objective:  BP 121/78   Pulse 87   Temp 98 F (36.7 C) (Oral)   Ht 6' 0.25" (1.835 m)   Wt 202 lb 3.2 oz (91.7 kg)   SpO2 96%   BMI 27.23 kg/m      04/04/2022    2:48 PM 09/28/2021    2:17 PM 03/31/2021    2:24 PM  BP/Weight  Systolic BP 096 045 409  Diastolic BP 78 87 96  Wt. (Lbs) 202.2 192.8 200.6  BMI 27.23 kg/m2 30.2 kg/m2 27.98 kg/m2      Physical Exam Constitutional:      Appearance: He is well-developed.  Cardiovascular:     Rate and Rhythm: Normal rate.     Heart sounds: Normal heart sounds. No murmur heard. Pulmonary:     Effort: Pulmonary effort is normal.     Breath sounds: Normal breath sounds. No wheezing or rales.  Chest:     Chest wall: No tenderness.  Abdominal:     General: Bowel sounds are normal. There is no distension.     Palpations: Abdomen is soft. There is no mass.     Tenderness: There is no abdominal tenderness.  Musculoskeletal:        General: Normal range of motion.     Right lower leg: No edema.     Left lower leg: No edema.  Neurological:     Mental Status: He is alert and oriented to person, place, and time.  Psychiatric:        Mood and Affect: Mood normal.        Latest Ref Rng & Units 03/31/2021    2:47 PM 05/18/2020    4:47 PM 09/03/2019    3:41 PM  CMP  Glucose 65 - 99 mg/dL 95  98  101   BUN 6 - 24 mg/dL '13  20  17   '$ Creatinine 0.76 - 1.27 mg/dL 0.75  0.75  0.91   Sodium 134 - 144 mmol/L 137  138  139   Potassium 3.5 - 5.2 mmol/L 4.5  4.6  4.2   Chloride 96 - 106  mmol/L 99  101  99   CO2 20 - 29 mmol/L '24  24  25   '$ Calcium 8.7 - 10.2 mg/dL 8.9  9.2  9.4   Total Protein 6.0 - 8.5 g/dL 6.2   6.6   Total Bilirubin 0.0 - 1.2 mg/dL 0.3   0.5   Alkaline Phos 44 - 121 IU/L 74   71   AST 0 - 40 IU/L 27   34   ALT 0 - 44 IU/L 21   32     Lipid Panel     Component Value Date/Time   CHOL 205 (H) 09/28/2021 1510   TRIG 120 09/28/2021 1510   HDL 61 09/28/2021 1510   CHOLHDL 4.1 03/31/2021 1447   CHOLHDL 2.0 05/18/2016 1511   VLDL 16 05/18/2016 1511   LDLCALC 123 (H) 09/28/2021 1510    CBC    Component Value Date/Time   WBC 5.7 03/31/2021 1447   WBC 5.5 01/28/2018 1841   RBC 5.48 03/31/2021 1447   RBC 5.50 01/28/2018 1841   HGB 15.8 03/31/2021 1447   HCT 47.4 03/31/2021 1447   PLT 195 03/31/2021 1447   MCV 87 03/31/2021 1447   MCH 28.8 03/31/2021 1447   MCH 27.1 01/28/2018 1841   MCHC 33.3 03/31/2021 1447   MCHC 31.5 01/28/2018 1841   RDW 14.4 03/31/2021 1447   LYMPHSABS 1.4 03/31/2021 1447   MONOABS 0.5 01/28/2018 1841   EOSABS 0.2 03/31/2021 1447   BASOSABS 0.1 03/31/2021 1447    Lab Results  Component Value Date   HGBA1C 5.6 09/28/2021    The 10-year ASCVD risk score (Arnett DK, et al., 2019) is: 9.5%   Values used to calculate the score:     Age: 38 years     Sex: Male     Is Non-Hispanic African American: No     Diabetic: No     Tobacco smoker: Yes     Systolic Blood Pressure: 607 mmHg     Is BP treated: Yes     HDL Cholesterol: 61 mg/dL     Total Cholesterol: 205 mg/dL  Assessment & Plan:  1. Sinusitis, unspecified chronicity, unspecified location Stable - fluticasone (FLONASE) 50 MCG/ACT nasal spray; Place 2 sprays into both nostrils daily.  Dispense: 16 g; Refill: 6  2. Essential hypertension Controlled Counseled on blood pressure goal of less than 130/80, low-sodium, DASH diet, medication compliance, 150 minutes of moderate intensity exercise per week. Discussed medication compliance, adverse effects. -  losartan (COZAAR) 50 MG tablet; Take 1 tablet (50 mg total) by mouth daily.  Dispense: 90 tablet; Refill: 1 - metoprolol tartrate (LOPRESSOR) 50 MG tablet; TAKE 1 TABLET (50 MG TOTAL) BY MOUTH 2 (TWO) TIMES DAILY.  Dispense: 180 tablet; Refill: 1  3. Hyperlipidemia, unspecified hyperlipidemia type Uncontrolled Discussed 10-year ASCVD risk score of 9.5% and advised on importance of taking medication After shared decision making he will restart his medication Low-cholesterol diet - atorvastatin (LIPITOR) 20 MG tablet; Take 1 tablet (20 mg total) by mouth daily.  Dispense: 90 tablet; Refill: 3  4. Prediabetes Labs reveal prediabetes with an A1c of 5.6.  Working on a low carbohydrate diet, exercise, weight loss is recommended in order to prevent progression to type 2 diabetes mellitus. Currently not taking metformin - Basic Metabolic Panel - Hemoglobin A1c  5. Encounter for vitamin deficiency screening - Vitamin B12 - Vitamin D, 25-hydroxy   6.  Smoking greater than 20-pack-year We have rescheduled his low-dose chest CT for lung cancer screening for him.  Counseled on smoking cessation but he is not ready to quit Meds ordered this encounter  Medications   fluticasone (FLONASE) 50 MCG/ACT nasal spray    Sig: Place 2 sprays into both nostrils daily.    Dispense:  16 g    Refill:  6   losartan (COZAAR) 50 MG tablet    Sig: Take 1 tablet (50 mg total) by mouth daily.    Dispense:  90 tablet    Refill:  1    Must have office visit for refills   atorvastatin (LIPITOR) 20 MG tablet    Sig: Take 1 tablet (20 mg total) by mouth daily.    Dispense:  90 tablet    Refill:  3   metoprolol tartrate (LOPRESSOR) 50 MG tablet    Sig: TAKE 1 TABLET (50 MG TOTAL) BY MOUTH 2 (TWO) TIMES DAILY.    Dispense:  180 tablet    Refill:  1    Follow-up: Return in about 6 months (around 10/03/2022) for Chronic medical conditions.       Charlott Rakes, MD, FAAFP. [facility] and  [place], [place]   04/04/2022, 3:33 PM

## 2022-04-04 NOTE — Progress Notes (Signed)
No concerns. Medication refill on Vit b12 and Vit d and Flonase

## 2022-04-04 NOTE — Patient Instructions (Signed)

## 2022-04-05 LAB — BASIC METABOLIC PANEL
BUN/Creatinine Ratio: 24 — ABNORMAL HIGH (ref 9–20)
BUN: 18 mg/dL (ref 6–24)
CO2: 25 mmol/L (ref 20–29)
Calcium: 9.3 mg/dL (ref 8.7–10.2)
Chloride: 103 mmol/L (ref 96–106)
Creatinine, Ser: 0.76 mg/dL (ref 0.76–1.27)
Glucose: 98 mg/dL (ref 70–99)
Potassium: 4.8 mmol/L (ref 3.5–5.2)
Sodium: 139 mmol/L (ref 134–144)
eGFR: 106 mL/min/{1.73_m2} (ref >59)

## 2022-04-05 LAB — HEMOGLOBIN A1C
Est. average glucose Bld gHb Est-mCnc: 123 mg/dL
Hgb A1c MFr Bld: 5.9 % — ABNORMAL HIGH (ref 4.8–5.6)

## 2022-04-05 LAB — VITAMIN D 25 HYDROXY (VIT D DEFICIENCY, FRACTURES): Vit D, 25-Hydroxy: 69.2 ng/mL (ref 30.0–100.0)

## 2022-04-05 LAB — VITAMIN B12: Vitamin B-12: 705 pg/mL (ref 232–1245)

## 2022-04-11 ENCOUNTER — Other Ambulatory Visit: Payer: Self-pay

## 2022-04-12 ENCOUNTER — Ambulatory Visit (HOSPITAL_COMMUNITY): Admission: RE | Admit: 2022-04-12 | Payer: Self-pay | Source: Ambulatory Visit

## 2022-04-15 ENCOUNTER — Other Ambulatory Visit: Payer: Self-pay

## 2022-05-10 ENCOUNTER — Other Ambulatory Visit: Payer: Self-pay

## 2022-05-31 ENCOUNTER — Other Ambulatory Visit: Payer: Self-pay

## 2022-06-30 ENCOUNTER — Other Ambulatory Visit: Payer: Self-pay

## 2022-07-01 ENCOUNTER — Other Ambulatory Visit: Payer: Self-pay

## 2022-07-28 ENCOUNTER — Other Ambulatory Visit: Payer: Self-pay

## 2022-07-29 ENCOUNTER — Other Ambulatory Visit: Payer: Self-pay

## 2022-08-01 ENCOUNTER — Other Ambulatory Visit: Payer: Self-pay

## 2022-08-31 ENCOUNTER — Other Ambulatory Visit: Payer: Self-pay

## 2022-09-28 ENCOUNTER — Other Ambulatory Visit: Payer: Self-pay | Admitting: Family Medicine

## 2022-09-28 ENCOUNTER — Other Ambulatory Visit: Payer: Self-pay

## 2022-09-28 DIAGNOSIS — I10 Essential (primary) hypertension: Secondary | ICD-10-CM

## 2022-09-28 MED ORDER — LOSARTAN POTASSIUM 50 MG PO TABS
50.0000 mg | ORAL_TABLET | Freq: Every day | ORAL | 0 refills | Status: DC
  Filled 2022-09-28: qty 30, 30d supply, fill #0

## 2022-09-28 NOTE — Telephone Encounter (Signed)
Requested Prescriptions  Pending Prescriptions Disp Refills   losartan (COZAAR) 50 MG tablet 30 tablet 0    Sig: Take 1 tablet (50 mg total) by mouth daily.     Cardiovascular:  Angiotensin Receptor Blockers Passed - 09/28/2022  1:49 PM      Passed - Cr in normal range and within 180 days    Creat  Date Value Ref Range Status  05/18/2016 0.87 0.60 - 1.35 mg/dL Final   Creatinine, Ser  Date Value Ref Range Status  04/04/2022 0.76 0.76 - 1.27 mg/dL Final         Passed - K in normal range and within 180 days    Potassium  Date Value Ref Range Status  04/04/2022 4.8 3.5 - 5.2 mmol/L Final         Passed - Patient is not pregnant      Passed - Last BP in normal range    BP Readings from Last 1 Encounters:  04/04/22 121/78         Passed - Valid encounter within last 6 months    Recent Outpatient Visits           5 months ago Prediabetes   [place], MD   1 year ago Prediabetes   [place], [place], MD   1 year ago Hyperlipidemia, unspecified hyperlipidemia type   [place], [place], [place]   2 years ago Hemorrhoids, unspecified hemorrhoid type   [place], [place]ong, MD   2 years ago Essential hypertension   [place], [place], NP       Future Appointments             In 5 days Charlott Rakes, MD [place]

## 2022-09-29 ENCOUNTER — Other Ambulatory Visit: Payer: Self-pay

## 2022-10-03 ENCOUNTER — Encounter: Payer: Self-pay | Admitting: Family Medicine

## 2022-10-03 ENCOUNTER — Other Ambulatory Visit: Payer: Self-pay

## 2022-10-03 ENCOUNTER — Ambulatory Visit: Payer: Self-pay | Attending: Family Medicine | Admitting: Family Medicine

## 2022-10-03 VITALS — BP 114/75 | HR 88 | Temp 98.1°F | Ht 67.0 in | Wt 210.0 lb

## 2022-10-03 DIAGNOSIS — I10 Essential (primary) hypertension: Secondary | ICD-10-CM

## 2022-10-03 DIAGNOSIS — F17210 Nicotine dependence, cigarettes, uncomplicated: Secondary | ICD-10-CM

## 2022-10-03 DIAGNOSIS — K219 Gastro-esophageal reflux disease without esophagitis: Secondary | ICD-10-CM

## 2022-10-03 DIAGNOSIS — R7303 Prediabetes: Secondary | ICD-10-CM

## 2022-10-03 DIAGNOSIS — E785 Hyperlipidemia, unspecified: Secondary | ICD-10-CM

## 2022-10-03 LAB — POCT GLYCOSYLATED HEMOGLOBIN (HGB A1C): HbA1c, POC (prediabetic range): 5.9 % (ref 5.7–6.4)

## 2022-10-03 MED ORDER — LOSARTAN POTASSIUM 25 MG PO TABS
25.0000 mg | ORAL_TABLET | Freq: Every day | ORAL | 1 refills | Status: DC
  Filled 2022-10-03: qty 90, 90d supply, fill #0
  Filled 2023-01-05: qty 90, 90d supply, fill #1

## 2022-10-03 MED ORDER — METOPROLOL TARTRATE 50 MG PO TABS
ORAL_TABLET | Freq: Two times a day (BID) | ORAL | 1 refills | Status: DC
  Filled 2022-10-03: qty 180, fill #0
  Filled 2022-10-26: qty 180, 90d supply, fill #0
  Filled 2023-01-24: qty 180, 90d supply, fill #1

## 2022-10-03 MED ORDER — ATORVASTATIN CALCIUM 20 MG PO TABS
20.0000 mg | ORAL_TABLET | Freq: Every day | ORAL | 3 refills | Status: DC
  Filled 2022-10-03: qty 90, 90d supply, fill #0

## 2022-10-03 MED ORDER — OMEPRAZOLE 20 MG PO CPDR
20.0000 mg | DELAYED_RELEASE_CAPSULE | Freq: Every day | ORAL | 1 refills | Status: DC
  Filled 2022-10-03 – 2023-09-20 (×2): qty 30, 30d supply, fill #0

## 2022-10-03 NOTE — Progress Notes (Signed)
Subjective:  Patient ID: Timothy Sanchez, male    DOB: 12/07/1965  Age: 57 y.o. MRN: [id number]  CC: Prediabetes   HPI Timothy Sanchez is a 57 y.o. 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   Interval History:  His blood pressure is low today and was also low at his last visit.  Endorses adherence with his antihypertensive and he has no hypotensive symptoms.  Endorses adherence with his statin and his PPI.  Exercises regularly by means of walking. Reflux symptoms are controlled. Back pain is also controlled. For prediabetes he does not take metformin. Back pain is controlled. Denies plans for additional concerns. Past Medical History:  Diagnosis Date   Allergy    GERD (gastroesophageal reflux disease)    Hemorrhoids    Hyperlipidemia    Hypertension    Spinal stenosis of lumbar region    Vitamin D deficiency     Past Surgical History:  Procedure Laterality Date   NO PAST SURGERIES      Family History  Problem Relation Age of Onset   Diabetes Mother    Hypertension Mother    Colon cancer Neg Hx    Esophageal cancer Neg Hx    Rectal cancer Neg Hx    Stomach cancer Neg Hx     Social History   Socioeconomic History   Marital status: Married    Spouse name: Not on file   Number of children: 0   Years of education: Not on file   Highest education level: Not on file  Occupational History   Occupation: Delivery driver  Tobacco Use   Smoking status: Heavy Smoker    Packs/day: 1.50    Years: 25.00    Total pack years: 37.50    Types: Cigarettes    Start date: 03/26/2013   Smokeless tobacco: Never  Vaping Use   Vaping Use: Never used  Substance and Sexual Activity   Alcohol use: Yes    Alcohol/week: 8.0 standard drinks of alcohol    Types: 1 Cans of beer, 7 Shots of liquor per week    Comment: daily   Drug use: No   Sexual activity: Not on file   Other Topics Concern   Not on file  Social History Narrative   ** Merged History Encounter **       Social Determinants of Health   Financial Resource Strain: Not on file  Food Insecurity: Not on file  Transportation Needs: Not on file  Physical Activity: Not on file  Stress: Not on file  Social Connections: Not on file    No Known Allergies  Outpatient Medications Prior to Visit  Medication Sig Dispense Refill   atorvastatin (LIPITOR) 20 MG tablet Take 1 tablet (20 mg total) by mouth daily. 90 tablet 3   fluticasone (FLONASE) 50 MCG/ACT nasal spray Place 2 sprays into both nostrils daily. 16 g 6   losartan (COZAAR) 50 MG tablet Take 1 tablet (50 mg total) by mouth daily. 30 tablet 0   metoprolol tartrate (LOPRESSOR) 50 MG tablet TAKE 1 TABLET (50 MG TOTAL) BY MOUTH 2 (TWO) TIMES DAILY. 180 tablet 1   sildenafil (VIAGRA) 50 MG tablet Take 1 tablet (50 mg total) by mouth daily as needed for erectile dysfunction. 10 tablet 1   Cholecalciferol (VITAMIN D3 PO) Take 1 tablet by mouth daily. (Patient not taking: Reported on 04/04/2022)  Cyanocobalamin (VITAMIN B-12 PO) Take 1 tablet by mouth daily. (Patient not taking: Reported on 04/04/2022)     cetirizine (ZYRTEC) 10 MG tablet Take 1 tablet (10 mg total) by mouth daily. (Patient not taking: Reported on 04/04/2022) 30 tablet 3   metFORMIN (GLUCOPHAGE) 500 MG tablet Take 1 tablet (500 mg total) by mouth 2 (two) times daily with a meal. (Patient not taking: Reported on 10/03/2022) 180 tablet 3   omeprazole (PRILOSEC) 20 MG capsule TAKE 1 CAPSULE (20 MG TOTAL) BY MOUTH DAILY. 90 capsule 1   No facility-administered medications prior to visit.     ROS Review of Systems  Constitutional:  Negative for activity change and appetite change.  HENT:  Negative for sinus pressure and sore throat.   Respiratory:  Negative for chest tightness, shortness of breath and wheezing.   Cardiovascular:  Negative for chest pain and palpitations.   Gastrointestinal:  Negative for abdominal distention, abdominal pain and constipation.  Genitourinary: Negative.   Musculoskeletal: Negative.   Psychiatric/Behavioral:  Negative for behavioral problems and dysphoric mood.     Objective:  BP 114/75   Pulse 88   Temp 98.1 F (36.7 C) (Oral)   Ht '5\' 7"'$  (1.702 m)   Wt 210 lb (95.3 kg)   SpO2 96%   BMI 32.89 kg/m      10/03/2022    3:39 PM 04/04/2022    2:48 PM 09/28/2021    2:17 PM  BP/Weight  Systolic BP [id number]  Diastolic BP 75 78 87  Wt. (Lbs) 210 202.2 192.8  BMI 32.89 kg/m2 27.23 kg/m2 30.2 kg/m2      Physical Exam Constitutional:      Appearance: He is well-developed.  Cardiovascular:     Rate and Rhythm: Normal rate.     Heart sounds: Normal heart sounds. No murmur heard. Pulmonary:     Effort: Pulmonary effort is normal.     Breath sounds: Normal breath sounds. No wheezing or rales.  Chest:     Chest wall: No tenderness.  Abdominal:     General: Bowel sounds are normal. There is no distension.     Palpations: Abdomen is soft. There is no mass.     Tenderness: There is no abdominal tenderness.  Musculoskeletal:        General: Normal range of motion.     Right lower leg: No edema.     Left lower leg: No edema.  Neurological:     Mental Status: He is alert and oriented to person, place, and time.  Psychiatric:        Mood and Affect: Mood normal.        Latest Ref Rng & Units 04/04/2022    3:28 PM 03/31/2021    2:47 PM 05/18/2020    4:47 PM  CMP  Glucose 70 - 99 mg/dL 98  95  98   BUN 6 - 24 mg/dL '18  13  20   '$ Creatinine 0.76 - 1.27 mg/dL 0.76  0.75  0.75   Sodium 134 - 144 mmol/L 139  137  138   Potassium 3.5 - 5.2 mmol/L 4.8  4.5  4.6   Chloride 96 - 106 mmol/L 103  99  101   CO2 20 - 29 mmol/L '25  24  24   '$ Calcium 8.7 - 10.2 mg/dL 9.3  8.9  9.2   Total Protein 6.0 - 8.5 g/dL  6.2    Total Bilirubin 0.0 - 1.2 mg/dL  0.3  Alkaline Phos 44 - 121 IU/L  74    AST 0 - 40 IU/L  27    ALT 0 -  44 IU/L  21      Lipid Panel     Component Value Date/Time   CHOL 205 (H) 09/28/2021 1510   TRIG 120 09/28/2021 1510   HDL 61 09/28/2021 1510   CHOLHDL 4.1 03/31/2021 1447   CHOLHDL 2.0 05/18/2016 1511   VLDL 16 05/18/2016 1511   LDLCALC 123 (H) 09/28/2021 1510    CBC    Component Value Date/Time   WBC 5.7 03/31/2021 1447   WBC 5.5 01/28/2018 1841   RBC 5.48 03/31/2021 1447   RBC 5.50 01/28/2018 1841   HGB 15.8 03/31/2021 1447   HCT 47.4 03/31/2021 1447   PLT 195 03/31/2021 1447   MCV 87 03/31/2021 1447   MCH 28.8 03/31/2021 1447   MCH 27.1 01/28/2018 1841   MCHC 33.3 03/31/2021 1447   MCHC 31.5 01/28/2018 1841   RDW 14.4 03/31/2021 1447   LYMPHSABS 1.4 03/31/2021 1447   MONOABS 0.5 01/28/2018 1841   EOSABS 0.2 03/31/2021 1447   BASOSABS 0.1 03/31/2021 1447    Lab Results  Component Value Date   HGBA1C 5.9 10/03/2022    Assessment & Plan:  1. Prediabetes Labs reveal prediabetes with an A1c of 5.9.  Working on a low carbohydrate diet, exercise, weight loss is recommended in order to prevent progression to type 2 diabetes mellitus. Does not take metformin but is on lifestyle modification - POCT glycosylated hemoglobin (Hb A1C)  2. Hyperlipidemia, unspecified hyperlipidemia type Slightly elevated LDL at 123 Continue statin Low-cholesterol diet - atorvastatin (LIPITOR) 20 MG tablet; Take 1 tablet (20 mg total) by mouth daily.  Dispense: 90 tablet; Refill: 3 - LP+Non-HDL Cholesterol  3. Essential hypertension Soft blood pressure Decrease losartan dose Counseled on blood pressure goal of less than 130/80, low-sodium, DASH diet, medication compliance, 150 minutes of moderate intensity exercise per week. Discussed medication compliance, adverse effects. - losartan (COZAAR) 25 MG tablet; Take 1 tablet (25 mg total) by mouth daily.  Dispense: 90 tablet; Refill: 1 - metoprolol tartrate (LOPRESSOR) 50 MG tablet; TAKE 1 TABLET (50 MG TOTAL) BY MOUTH 2 (TWO) TIMES  DAILY.  Dispense: 180 tablet; Refill: 1 - CMP14+EGFR  4. Gastroesophageal reflux disease without esophagitis Controlled Advised to avoid recumbency up to 2 hours postmeal, avoid late meals, avoid foods that trigger symptoms. - omeprazole (PRILOSEC) 20 MG capsule; TAKE 1 CAPSULE (20 MG TOTAL) BY MOUTH DAILY.  Dispense: 90 capsule; Refill: 1 - CBC with Differential/Platelet  5. Smoking greater than 20 pack years Spent 3 minutes counseling on smoking cessation but he is not ready to quit - CT CHEST LUNG CANCER SCREENING LOW DOSE WO CONTRAST; Future    Meds ordered this encounter  Medications   atorvastatin (LIPITOR) 20 MG tablet    Sig: Take 1 tablet (20 mg total) by mouth daily.    Dispense:  90 tablet    Refill:  3   losartan (COZAAR) 25 MG tablet    Sig: Take 1 tablet (25 mg total) by mouth daily.    Dispense:  90 tablet    Refill:  1    Dose decrease   metoprolol tartrate (LOPRESSOR) 50 MG tablet    Sig: TAKE 1 TABLET (50 MG TOTAL) BY MOUTH 2 (TWO) TIMES DAILY.    Dispense:  180 tablet    Refill:  1   omeprazole (PRILOSEC) 20 MG capsule  Sig: TAKE 1 CAPSULE (20 MG TOTAL) BY MOUTH DAILY.    Dispense:  90 capsule    Refill:  1    Follow-up: No follow-ups on file.       Charlott Rakes, MD, FAAFP. [facility] and [place], [place]   10/03/2022, 3:52 PM

## 2022-10-03 NOTE — Patient Instructions (Signed)

## 2022-10-04 LAB — CBC WITH DIFFERENTIAL/PLATELET
Basophils Absolute: 0.1 10*3/uL (ref 0.0–0.2)
Basos: 1 %
EOS (ABSOLUTE): 0.2 10*3/uL (ref 0.0–0.4)
Eos: 3 %
Hematocrit: 46.9 % (ref 37.5–51.0)
Hemoglobin: 15.6 g/dL (ref 13.0–17.7)
Immature Grans (Abs): 0 10*3/uL (ref 0.0–0.1)
Immature Granulocytes: 1 %
Lymphocytes Absolute: 1.4 10*3/uL (ref 0.7–3.1)
Lymphs: 24 %
MCH: 28.9 pg (ref 26.6–33.0)
MCHC: 33.3 g/dL (ref 31.5–35.7)
MCV: 87 fL (ref 79–97)
Monocytes Absolute: 0.6 10*3/uL (ref 0.1–0.9)
Monocytes: 10 %
Neutrophils Absolute: 3.5 10*3/uL (ref 1.4–7.0)
Neutrophils: 61 %
Platelets: 187 10*3/uL (ref 150–450)
RBC: 5.39 x10E6/uL (ref 4.14–5.80)
RDW: 14 % (ref 11.6–15.4)
WBC: 5.7 10*3/uL (ref 3.4–10.8)

## 2022-10-04 LAB — CMP14+EGFR
ALT: 18 IU/L (ref 0–44)
AST: 22 IU/L (ref 0–40)
Albumin/Globulin Ratio: 2 (ref 1.2–2.2)
Albumin: 4 g/dL (ref 3.8–4.9)
Alkaline Phosphatase: 75 IU/L (ref 44–121)
BUN/Creatinine Ratio: 18 (ref 9–20)
BUN: 17 mg/dL (ref 6–24)
Bilirubin Total: 0.5 mg/dL (ref 0.0–1.2)
CO2: 22 mmol/L (ref 20–29)
Calcium: 9.2 mg/dL (ref 8.7–10.2)
Chloride: 101 mmol/L (ref 96–106)
Creatinine, Ser: 0.93 mg/dL (ref 0.76–1.27)
Globulin, Total: 2 g/dL (ref 1.5–4.5)
Glucose: 111 mg/dL — ABNORMAL HIGH (ref 70–99)
Potassium: 4.3 mmol/L (ref 3.5–5.2)
Sodium: 138 mmol/L (ref 134–144)
Total Protein: 6 g/dL (ref 6.0–8.5)
eGFR: 97 mL/min/{1.73_m2} (ref >59)

## 2022-10-04 LAB — LP+NON-HDL CHOLESTEROL
Cholesterol, Total: 198 mg/dL (ref 100–199)
HDL: 61 mg/dL (ref >39)
LDL Chol Calc (NIH): 102 mg/dL — ABNORMAL HIGH (ref 0–99)
Total Non-HDL-Chol (LDL+VLDL): 137 mg/dL — ABNORMAL HIGH (ref 0–129)
Triglycerides: 209 mg/dL — ABNORMAL HIGH (ref 0–149)
VLDL Cholesterol Cal: 35 mg/dL (ref 5–40)

## 2022-10-26 ENCOUNTER — Other Ambulatory Visit: Payer: Self-pay

## 2022-10-27 ENCOUNTER — Other Ambulatory Visit: Payer: Self-pay

## 2022-11-07 ENCOUNTER — Inpatient Hospital Stay: Admission: RE | Admit: 2022-11-07 | Payer: Self-pay | Source: Ambulatory Visit

## 2023-01-11 ENCOUNTER — Encounter (HOSPITAL_COMMUNITY): Payer: Self-pay

## 2023-01-11 ENCOUNTER — Emergency Department (HOSPITAL_COMMUNITY)
Admission: EM | Admit: 2023-01-11 | Discharge: 2023-01-12 | Disposition: A | Discharge status: Home / Self Care | Payer: BLUE CROSS/BLUE SHIELD | Attending: Student | Admitting: Student

## 2023-01-11 ENCOUNTER — Emergency Department (HOSPITAL_COMMUNITY): Payer: BLUE CROSS/BLUE SHIELD

## 2023-01-11 ENCOUNTER — Other Ambulatory Visit: Payer: Self-pay

## 2023-01-11 DIAGNOSIS — R000 Tachycardia, unspecified: Secondary | ICD-10-CM | POA: Diagnosis not present

## 2023-01-11 DIAGNOSIS — K852 Alcohol induced acute pancreatitis without necrosis or infection: Secondary | ICD-10-CM | POA: Insufficient documentation

## 2023-01-11 DIAGNOSIS — R1013 Epigastric pain: Secondary | ICD-10-CM | POA: Diagnosis present

## 2023-01-11 DIAGNOSIS — Z79899 Other long term (current) drug therapy: Secondary | ICD-10-CM | POA: Diagnosis not present

## 2023-01-11 DIAGNOSIS — I10 Essential (primary) hypertension: Secondary | ICD-10-CM | POA: Insufficient documentation

## 2023-01-11 LAB — CBC
HCT: 49.8 % (ref 39.0–52.0)
Hemoglobin: 16.5 g/dL (ref 13.0–17.0)
MCH: 29 pg (ref 26.0–34.0)
MCHC: 33.1 g/dL (ref 30.0–36.0)
MCV: 87.7 fL (ref 80.0–100.0)
Platelets: 187 10*3/uL (ref 150–400)
RBC: 5.68 MIL/uL (ref 4.22–5.81)
RDW: 15.8 % — ABNORMAL HIGH (ref 11.5–15.5)
WBC: 6.9 10*3/uL (ref 4.0–10.5)
nRBC: 0 % (ref 0.0–0.2)

## 2023-01-11 LAB — COMPREHENSIVE METABOLIC PANEL
ALT: 24 U/L (ref 0–44)
AST: 30 U/L (ref 15–41)
Albumin: 3.6 g/dL (ref 3.5–5.0)
Alkaline Phosphatase: 59 U/L (ref 38–126)
Anion gap: 9 (ref 5–15)
BUN: 17 mg/dL (ref 6–20)
CO2: 22 mmol/L (ref 22–32)
Calcium: 8.8 mg/dL — ABNORMAL LOW (ref 8.9–10.3)
Chloride: 100 mmol/L (ref 98–111)
Creatinine, Ser: 0.93 mg/dL (ref 0.61–1.24)
GFR, Estimated: >60 mL/min (ref >60)
Glucose, Bld: 178 mg/dL — ABNORMAL HIGH (ref 70–99)
Potassium: 3.3 mmol/L — ABNORMAL LOW (ref 3.5–5.1)
Sodium: 131 mmol/L — ABNORMAL LOW (ref 135–145)
Total Bilirubin: 0.6 mg/dL (ref 0.3–1.2)
Total Protein: 6.5 g/dL (ref 6.5–8.1)

## 2023-01-11 LAB — LIPASE, BLOOD: Lipase: 146 U/L — ABNORMAL HIGH (ref 11–51)

## 2023-01-11 LAB — TROPONIN I (HIGH SENSITIVITY): Troponin I (High Sensitivity): 5 ng/L (ref <18)

## 2023-01-11 MED ORDER — LORAZEPAM 2 MG/ML IJ SOLN: 0.0000 mg | Freq: Four times a day (QID) | INTRAMUSCULAR | Status: DC

## 2023-01-11 MED ORDER — THIAMINE HCL 100 MG/ML IJ SOLN: 100.0000 mg | Freq: Every day | INTRAMUSCULAR | Status: DC

## 2023-01-11 MED ORDER — IOHEXOL 300 MG/ML  SOLN
100.0000 mL | Freq: Once | INTRAMUSCULAR | Status: AC | PRN
  Administered 2023-01-11: 100 mL via INTRAVENOUS

## 2023-01-11 MED ORDER — HYDROMORPHONE HCL 1 MG/ML IJ SOLN
1.0000 mg | Freq: Once | INTRAMUSCULAR | Status: AC
  Administered 2023-01-12: 1 mg via INTRAVENOUS
  Filled 2023-01-11: qty 1

## 2023-01-11 MED ORDER — LORAZEPAM 2 MG/ML IJ SOLN: 0.0000 mg | Freq: Two times a day (BID) | INTRAMUSCULAR | Status: DC

## 2023-01-11 MED ORDER — THIAMINE MONONITRATE 100 MG PO TABS
100.0000 mg | ORAL_TABLET | Freq: Every day | ORAL | Status: DC
  Administered 2023-01-12: 100 mg via ORAL
  Filled 2023-01-11: qty 1

## 2023-01-11 MED ORDER — LORAZEPAM 1 MG PO TABS: 0.0000 mg | ORAL_TABLET | Freq: Four times a day (QID) | ORAL | Status: DC

## 2023-01-11 MED ORDER — ONDANSETRON HCL 4 MG/2ML IJ SOLN
4.0000 mg | Freq: Once | INTRAMUSCULAR | Status: AC
  Administered 2023-01-12: 4 mg via INTRAVENOUS
  Filled 2023-01-11: qty 2

## 2023-01-11 MED ORDER — LORAZEPAM 1 MG PO TABS: 0.0000 mg | ORAL_TABLET | Freq: Two times a day (BID) | ORAL | Status: DC

## 2023-01-11 MED ORDER — SODIUM CHLORIDE 0.9 % IV BOLUS
1000.0000 mL | Freq: Once | INTRAVENOUS | Status: AC
  Administered 2023-01-11: 1000 mL via INTRAVENOUS

## 2023-01-11 NOTE — ED Triage Notes (Signed)
Reports epigastric/ chest pain beginning last night after eating a big dinner. Says pain is stationary and no aggravating factors. Has vomited but says he generally vomits every morning.

## 2023-01-12 ENCOUNTER — Other Ambulatory Visit: Payer: Self-pay

## 2023-01-12 LAB — TROPONIN I (HIGH SENSITIVITY): Troponin I (High Sensitivity): 5 ng/L (ref <18)

## 2023-01-12 MED ORDER — OXYCODONE-ACETAMINOPHEN 5-325 MG PO TABS
1.0000 | ORAL_TABLET | Freq: Three times a day (TID) | ORAL | 0 refills | Status: AC | PRN
  Filled 2023-01-12: qty 9, 3d supply, fill #0

## 2023-01-12 MED ORDER — ONDANSETRON HCL 4 MG PO TABS
4.0000 mg | ORAL_TABLET | Freq: Four times a day (QID) | ORAL | 0 refills | Status: AC
  Filled 2023-01-12: qty 12, 3d supply, fill #0

## 2023-01-12 NOTE — Discharge Instructions (Addendum)
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  I recommend a bland diet, next couple days and slowly increase it as your diet tolerates it.  Continue Zofran for nausea, and pain medication.  I have given you a short course of narcotics please take as prescribed.  This medication can make you drowsy do not consume alcohol or operate heavy machinery when taking this medication.  This medication is Tylenol in it do not take Tylenol and take this medication.   Please follow-up with your primary doctor for further assessment  If you feel you need help with alcohol consumption I have given you  outpatient resources.  Come back to the emergency department if you develop chest pain, shortness of breath, severe abdominal pain, uncontrolled nausea, vomiting, diarrhea.

## 2023-01-12 NOTE — ED Provider Notes (Signed)
[place] [facility]ERGENCY DEPARTMENT AT [facility] Provider Note   CSN: [id number] Arrival date & time: 01/11/23  2108     History  Chief Complaint  Patient presents with   Abdominal Pain    Ravinder A L Marsh is a 57 y.o. male.  HPI   Patient with medical history including hypertension, hyperlipidemia, GERD presenting with abdominal pain, states is epigastric region, started yesterday, states it will radiate into his back, describes a sharp pain, associated nausea without vomiting, still passing gas having normal bowel movements.  He is concerned that this might be eating too much lamb, he does admit that he drinks alcohol daily, about 4 shots and a single beer, states he is never had alcohol withdrawals and had to be hospital for withdrawals, he has had no abdominal surgeries, he denies any bloody stool or dark tarry stools, no associate chest pain shortness of breath, no associate fever chills cough congestion.  He has no other complaints.  Patient's chart seen by GI, has had upper endoscopy performed 3 years ago which showed esophagitis, currently on PPIs.  Home Medications Prior to Admission medications   Medication Sig Start Date End Date Taking? Authorizing Provider  ondansetron (ZOFRAN) 4 MG tablet Take 1 tablet (4 mg total) by mouth every 6 (six) hours. 01/12/23  Yes Carroll Sage, PA-C  oxyCODONE-acetaminophen (PERCOCET/ROXICET) 5-325 MG tablet Take 1 tablet by mouth every 8 (eight) hours as needed for up to 3 days for severe pain. 01/12/23 01/15/23 Yes Carroll Sage, PA-C  atorvastatin (LIPITOR) 20 MG tablet Take 1 tablet (20 mg total) by mouth daily. 10/03/22   Hoy Register, MD  Cholecalciferol (VITAMIN D3 PO) Take 1 tablet by mouth daily. Patient not taking: Reported on 04/04/2022    [provider]  Cyanocobalamin (VITAMIN B-12 PO) Take 1 tablet by mouth daily. Patient not taking: Reported on 04/04/2022    [provider]  losartan  (COZAAR) 25 MG tablet Take 1 tablet (25 mg total) by mouth daily. 10/03/22   Hoy Register, MD  metoprolol tartrate (LOPRESSOR) 50 MG tablet TAKE 1 TABLET (50 MG TOTAL) BY MOUTH 2 (TWO) TIMES DAILY. 10/03/22 10/03/23  Hoy Register, MD  omeprazole (PRILOSEC) 20 MG capsule Take 1 capsule (20 mg total) by mouth daily. 10/03/22   Hoy Register, MD      Allergies    Patient has no known allergies.    Review of Systems   Review of Systems  Constitutional:  Negative for chills and fever.  Respiratory:  Negative for shortness of breath.   Cardiovascular:  Negative for chest pain.  Gastrointestinal:  Positive for abdominal pain and nausea. Negative for vomiting.  Neurological:  Negative for headaches.    Physical Exam Updated Vital Signs BP (!) 155/104 (BP Location: Left Arm)   Pulse 99   Temp (!) 97.4 F (36.3 C) (Oral)   Resp 17   Ht 5\' 10"  (1.778 m)   Wt 93 kg   SpO2 95%   BMI 29.41 kg/m  Physical Exam Vitals and nursing note reviewed.  Constitutional:      General: He is not in acute distress.    Appearance: He is not ill-appearing.  HENT:     Head: Normocephalic and atraumatic.     Nose: No congestion.  Eyes:     Conjunctiva/sclera: Conjunctivae normal.  Cardiovascular:     Rate and Rhythm: Regular rhythm. Tachycardia present.     Pulses: Normal pulses.     Heart sounds:  No murmur heard.    No friction rub. No gallop.  Pulmonary:     Effort: No respiratory distress.     Breath sounds: No wheezing, rhonchi or rales.  Abdominal:     Palpations: Abdomen is soft.     Tenderness: There is abdominal tenderness. There is no right CVA tenderness or left CVA tenderness.     Comments: Abdomen nondistended, soft, minimal epigastric tenderness without guarding redness or peritoneal sign no right upper quadrant tenderness or Murphy sign.  Skin:    General: Skin is warm and dry.  Neurological:     Mental Status: He is alert.  Psychiatric:        Mood and Affect: Mood normal.      Comments: Patient is nontremulous on my exam does not appear to be anxious     ED Results / Procedures / Treatments   Labs (all labs ordered are listed, but only abnormal results are displayed) Labs Reviewed  LIPASE, BLOOD - Abnormal; Notable for the following components:      Result Value   Lipase 146 (*)    All other components within normal limits  COMPREHENSIVE METABOLIC PANEL - Abnormal; Notable for the following components:   Sodium 131 (*)    Potassium 3.3 (*)    Glucose, Bld 178 (*)    Calcium 8.8 (*)    All other components within normal limits  CBC - Abnormal; Notable for the following components:   RDW 15.8 (*)    All other components within normal limits  TROPONIN I (HIGH SENSITIVITY)  TROPONIN I (HIGH SENSITIVITY)    EKG None  Radiology CT ABDOMEN PELVIS W CONTRAST  Result Date: 01/12/2023 CLINICAL DATA:  Epigastric/chest pain beginning last night after eating dinner. Vomiting. EXAM: CT ABDOMEN AND PELVIS WITH CONTRAST TECHNIQUE: Multidetector CT imaging of the abdomen and pelvis was performed using the standard protocol following bolus administration of intravenous contrast. RADIATION DOSE REDUCTION: This exam was performed according to the departmental dose-optimization program which includes automated exposure control, adjustment of the mA and/or kV according to patient size and/or use of iterative reconstruction technique. CONTRAST:  OMNIPAQUE IOHEXOL 300 MG/ML  SOLN COMPARISON:  None Available. FINDINGS: Lower chest: No acute abnormality. Hepatobiliary: Hepatic steatosis. Normal gallbladder. No biliary dilation. Pancreas: Hazy stranding about the pancreatic head compatible pancreatitis. No pancreatic ductal dilation evidence of pancreatic necrosis. No organized fluid collection. Spleen: Unremarkable. Adrenals/Urinary Tract: Normal adrenal glands. No urinary calculi or hydronephrosis. Bladder is unremarkable. Stomach/Bowel: Normal caliber large and small  bowel. Duodenal wall thickening adjacent to the pancreatic head. Stomach is within normal limits. Normal appendix Vascular/Lymphatic: Aortic atherosclerosis. Portal vein is patent. No enlarged abdominal or pelvic lymph nodes. Reproductive: Unremarkable. Other: No free intraperitoneal air. Musculoskeletal: No acute fracture. IMPRESSION: 1. Acute pancreatitis. No evidence of pancreatic necrosis or organized fluid collection. 2. Duodenal wall thickening adjacent to the pancreatic head likely reflects reactive duodenitis. 3. Hepatic steatosis. Aortic Atherosclerosis (ICD10-I70.0). Electronically Signed   By: Minerva Fester M.D.   On: 01/12/2023 00:00    Procedures Procedures    Medications Ordered in ED Medications  LORazepam (ATIVAN) injection 0-4 mg ( Intravenous See Alternative 01/12/23 0006)    Or  LORazepam (ATIVAN) tablet 0-4 mg ( Oral Not Given 01/12/23 0006)  LORazepam (ATIVAN) injection 0-4 mg (has no administration in time range)    Or  LORazepam (ATIVAN) tablet 0-4 mg (has no administration in time range)  thiamine (VITAMIN B1) tablet 100 mg (100 mg Oral  Given 01/12/23 0002)    Or  thiamine (VITAMIN B1) injection 100 mg ( Intravenous See Alternative 01/12/23 0002)  sodium chloride 0.9 % bolus 1,000 mL (0 mLs Intravenous Stopped 01/12/23 0056)  HYDROmorphone (DILAUDID) injection 1 mg (1 mg Intravenous Given 01/12/23 0000)  ondansetron (ZOFRAN) injection 4 mg (4 mg Intravenous Given 01/12/23 0001)  iohexol (OMNIPAQUE) 300 MG/ML solution 100 mL (100 mLs Intravenous Contrast Given 01/11/23 2342)    ED Course/ Medical Decision Making/ A&P                             Medical Decision Making Amount and/or Complexity of Data Reviewed Labs: ordered. Radiology: ordered.  Risk OTC drugs. Prescription drug management.   This patient presents to the ED for concern of epigastric pain, this involves an extensive number of treatment options, and is a complaint that carries with it a high risk of  complications and morbidity.  The differential diagnosis includes AAA, dissection, ACS, PE, pancreatitis, cholecystitis    Additional history obtained:  Additional history obtained from N/A External records from outside source obtained and reviewed including GI notes   Co morbidities that complicate the patient evaluation  Hyperlipidemia, alcohol dependency  Social Determinants of Health:  N/A    Lab Tests:  I Ordered, and personally interpreted labs.  The pertinent results include:  CBC is unremarkable, CMP reveals sodium 131, potassium 3.3, glucose 178, calcium 8.8, lipase 146, negative delta troponin   Imaging Studies ordered:  I ordered imaging studies including CT AP I independently visualized and interpreted imaging which showed pancreatitis without other abnormalities I agree with the radiologist interpretation   Cardiac Monitoring:  The patient was maintained on a cardiac monitor.  I personally viewed and interpreted the cardiac monitored which showed an underlying rhythm of: Sinus tach without signs of ischemia   Medicines ordered and prescription drug management:  I ordered medication including fluids, pain medication, antiemetics I have reviewed the patients home medicines and have made adjustments as needed  Critical Interventions:  N/A   Reevaluation:  Presents with abdominal pain, triage obtain basic lab workup which I personally reviewed, concerning for acute pancreatitis, exam is consistent with this, will obtain CT imaging for further evaluation.  Patient is reassessed, updated on imaging, he is found resting comfortably, states his pain is completely resolved, he is tolerating p.o., abdomen soft nontender, patient is agreement discharge at this time.    Consultations Obtained:  N/a    Test Considered:  N/a    Rule out low suspicion for lower lobe pneumonia as lung sounds are clear bilaterally, will defer imaging at this time.   Doubt ACS EKG without signs of ischemia patient is negative delta troponins.  I doubt PE's none tachypneic nonhypoxic, he did have initial tachycardia but this has since resolved, likely from pain.  I doubt AAA or dissection presentation atypical, seems more consistent likely acute pancreatitis.  I have low suspicion for liver or gallbladder abnormality as she has no right upper quadrant tenderness, liver enzymes, alk phos, T bili all within normal limits.  Suspicion for bowel obstruction, volvulus, diverticulitis, appendicitis, acute cholecystitis is low CT imaging is all negative these findings.  I doubt patient has been admitted for alcohol withdrawal as she has a negative CIWA, his vital signs improved after fluids and pain medication, he is nontremulous on my exam.    Dispostion and problem list  After consideration of the diagnostic results and  the patients response to treatment, I feel that the patent would benefit from discharge.  Pancreatitis-likely this is alcohol induced, will provide him outpatient treatment for alcohol dependency, will recommend a bland diet, given antiemetics, pain medication follow-up with PCP for further assessment strict return precautions.            Final Clinical Impression(s) / ED Diagnoses Final diagnoses:  Alcohol-induced acute pancreatitis without infection or necrosis    Rx / DC Orders ED Discharge Orders          Ordered    ondansetron (ZOFRAN) 4 MG tablet  Every 6 hours        01/12/23 0216    oxyCODONE-acetaminophen (PERCOCET/ROXICET) 5-325 MG tablet  Every 8 hours PRN        01/12/23 0216              Carroll Sage, PA-C 01/12/23 0220    Glendora Score, MD 01/12/23 [phone]

## 2023-01-12 NOTE — ED Notes (Addendum)
CIWA score currently 0, no Ativan needed at this time

## 2023-01-25 ENCOUNTER — Other Ambulatory Visit: Payer: Self-pay

## 2023-04-05 ENCOUNTER — Encounter: Payer: Self-pay | Admitting: Family Medicine

## 2023-04-05 ENCOUNTER — Ambulatory Visit: Payer: BLUE CROSS/BLUE SHIELD | Attending: Family Medicine | Admitting: Family Medicine

## 2023-04-05 ENCOUNTER — Other Ambulatory Visit: Payer: Self-pay

## 2023-04-05 VITALS — BP 134/87 | HR 88 | Ht 70.0 in | Wt 209.6 lb

## 2023-04-05 DIAGNOSIS — R7303 Prediabetes: Secondary | ICD-10-CM

## 2023-04-05 DIAGNOSIS — K859 Acute pancreatitis without necrosis or infection, unspecified: Secondary | ICD-10-CM | POA: Diagnosis not present

## 2023-04-05 DIAGNOSIS — E785 Hyperlipidemia, unspecified: Secondary | ICD-10-CM | POA: Diagnosis not present

## 2023-04-05 DIAGNOSIS — I10 Essential (primary) hypertension: Secondary | ICD-10-CM

## 2023-04-05 LAB — POCT GLYCOSYLATED HEMOGLOBIN (HGB A1C): HbA1c, POC (controlled diabetic range): 6 % (ref 0.0–7.0)

## 2023-04-05 MED ORDER — LOSARTAN POTASSIUM 25 MG PO TABS
25.0000 mg | ORAL_TABLET | Freq: Every day | ORAL | 1 refills | Status: DC
  Filled 2023-04-05 – 2023-04-13 (×2): qty 90, 90d supply, fill #0
  Filled 2023-07-07: qty 90, 90d supply, fill #1

## 2023-04-05 MED ORDER — METOPROLOL TARTRATE 50 MG PO TABS
50.0000 mg | ORAL_TABLET | Freq: Two times a day (BID) | ORAL | 1 refills | Status: DC
  Filled 2023-04-05 – 2023-04-13 (×2): qty 180, 90d supply, fill #0
  Filled 2023-07-22: qty 180, 90d supply, fill #1

## 2023-04-05 MED ORDER — ATORVASTATIN CALCIUM 20 MG PO TABS
20.0000 mg | ORAL_TABLET | Freq: Every day | ORAL | 1 refills | Status: DC
Start: 2023-04-05 — End: 2023-10-03
  Filled 2023-04-05 – 2023-09-20 (×2): qty 90, 90d supply, fill #0

## 2023-04-05 NOTE — Progress Notes (Signed)
Subjective:  Patient ID: Timothy Sanchez, male    DOB: 1965-10-07  Age: 57 y.o. MRN: [id number]  CC: Medical Management of Chronic Issues   HPI Timothy Sanchez is a 57 y.o. 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   Interval History: Discussed the use of AI scribe software for clinical note transcription with the patient, who gave verbal consent to proceed.  He presents for a routine follow-up. He reports no current concerns and is doing well at work. He is currently on atorvastatin for cholesterol management and losartan and metoprolol for blood pressure control. He reports no issues with his blood pressure. He admits to occasional exercise.  His last blood test in June showed slight hypokalemia of 3.4. Placed prediabetes he is on diet control.  The patient had a bout of pancreatitis about three months ago, which was attributed to alcohol consumption. He experienced severe back pain and nausea during the episode. He has since stopped drinking alcohol and the pain has not recurred.  He also reports back pain, which he attributes to long hours of driving for work.  The patient is a smoker and was scheduled for a CT scan, but it was not done due to the fact that he was informed "he did not have enough insurance".       Past Medical History:  Diagnosis Date   Allergy    GERD (gastroesophageal reflux disease)    Hemorrhoids    Hyperlipidemia    Hypertension    Spinal stenosis of lumbar region    Vitamin D deficiency     Past Surgical History:  Procedure Laterality Date   NO PAST SURGERIES      Family History  Problem Relation Age of Onset   Diabetes Mother    Hypertension Mother    Colon cancer Neg Hx    Esophageal cancer Neg Hx    Rectal cancer Neg Hx    Stomach cancer Neg Hx     Social History   Socioeconomic History   Marital status: Married     Spouse name: Not on file   Number of children: 0   Years of education: Not on file   Highest education level: Not on file  Occupational History   Occupation: Delivery driver  Tobacco Use   Smoking status: Heavy Smoker    Current packs/day: 1.50    Average packs/day: 1.5 packs/day for 25.0 years (37.5 ttl pk-yrs)    Types: Cigarettes    Start date: 03/26/2013   Smokeless tobacco: Never  Vaping Use   Vaping status: Never Used  Substance and Sexual Activity   Alcohol use: Yes    Alcohol/week: 8.0 standard drinks of alcohol    Types: 1 Cans of beer, 7 Shots of liquor per week    Comment: daily   Drug use: No   Sexual activity: Not on file  Other Topics Concern   Not on file  Social History Narrative   ** Merged History Encounter **       Social Determinants of Health   Financial Resource Strain: Not on file  Food Insecurity: Not on file  Transportation Needs: Not on file  Physical Activity: Not on file  Stress: Not on file  Social Connections: Not on file    No Known Allergies  Outpatient Medications Prior to Visit  Medication Sig Dispense Refill   Cholecalciferol (VITAMIN D3 PO)  Take 1 tablet by mouth daily.     Cyanocobalamin (VITAMIN B-12 PO) Take 1 tablet by mouth daily.     omeprazole (PRILOSEC) 20 MG capsule Take 1 capsule (20 mg total) by mouth daily. 90 capsule 1   ondansetron (ZOFRAN) 4 MG tablet Take 1 tablet (4 mg total) by mouth every 6 (six) hours. 12 tablet 0   atorvastatin (LIPITOR) 20 MG tablet Take 1 tablet (20 mg total) by mouth daily. 90 tablet 3   losartan (COZAAR) 25 MG tablet Take 1 tablet (25 mg total) by mouth daily. 90 tablet 1   metoprolol tartrate (LOPRESSOR) 50 MG tablet TAKE 1 TABLET (50 MG TOTAL) BY MOUTH 2 (TWO) TIMES DAILY. 180 tablet 1   No facility-administered medications prior to visit.     ROS Review of Systems  Constitutional:  Negative for activity change and appetite change.  HENT:  Negative for sinus pressure and sore throat.    Respiratory:  Negative for chest tightness, shortness of breath and wheezing.   Cardiovascular:  Negative for chest pain and palpitations.  Gastrointestinal:  Negative for abdominal distention, abdominal pain and constipation.  Genitourinary: Negative.   Musculoskeletal: Negative.   Psychiatric/Behavioral:  Negative for behavioral problems and dysphoric mood.     Objective:  BP 134/87   Pulse 88   Ht 5\' 10"  (1.778 m)   Wt 209 lb 9.6 oz (95.1 kg)   SpO2 97%   BMI 30.07 kg/m      04/05/2023    3:38 PM 04/05/2023    3:18 PM 04/05/2023    3:01 PM  BP/Weight  Systolic BP 134 141 142  Diastolic BP 87 92 89  Wt. (Lbs)   209.6  BMI   30.07 kg/m2      Physical Exam Constitutional:      Appearance: He is well-developed.  Cardiovascular:     Rate and Rhythm: Normal rate.     Heart sounds: Normal heart sounds. No murmur heard. Pulmonary:     Effort: Pulmonary effort is normal.     Breath sounds: Normal breath sounds. No wheezing or rales.  Chest:     Chest wall: No tenderness.  Abdominal:     General: Bowel sounds are normal. There is no distension.     Palpations: Abdomen is soft. There is no mass.     Tenderness: There is no abdominal tenderness.  Musculoskeletal:        General: Normal range of motion.     Right lower leg: No edema.     Left lower leg: No edema.  Neurological:     Mental Status: He is alert and oriented to person, place, and time.  Psychiatric:        Mood and Affect: Mood normal.        Latest Ref Rng & Units 01/11/2023    9:33 PM 10/03/2022    4:32 PM 04/04/2022    3:28 PM  CMP  Glucose 70 - 99 mg/dL 308  657  98   BUN 6 - 20 mg/dL 17  17  18    Creatinine 0.61 - 1.24 mg/dL 8.46  9.62  9.52   Sodium 135 - 145 mmol/L 131  138  139   Potassium 3.5 - 5.1 mmol/L 3.3  4.3  4.8   Chloride 98 - 111 mmol/L 100  101  103   CO2 22 - 32 mmol/L 22  22  25    Calcium 8.9 - 10.3 mg/dL 8.8  9.2  9.3  Total Protein 6.5 - 8.1 g/dL 6.5  6.0    Total  Bilirubin 0.3 - 1.2 mg/dL 0.6  0.5    Alkaline Phos 38 - 126 U/L 59  75    AST 15 - 41 U/L 30  22    ALT 0 - 44 U/L 24  18      Lipid Panel     Component Value Date/Time   CHOL 198 10/03/2022 1632   TRIG 209 (H) 10/03/2022 1632   HDL 61 10/03/2022 1632   CHOLHDL 4.1 03/31/2021 1447   CHOLHDL 2.0 05/18/2016 1511   VLDL 16 05/18/2016 1511   LDLCALC 102 (H) 10/03/2022 1632    CBC    Component Value Date/Time   WBC 6.9 01/11/2023 2133   RBC 5.68 01/11/2023 2133   HGB 16.5 01/11/2023 2133   HGB 15.6 10/03/2022 1632   HCT 49.8 01/11/2023 2133   HCT 46.9 10/03/2022 1632   PLT 187 01/11/2023 2133   PLT 187 10/03/2022 1632   MCV 87.7 01/11/2023 2133   MCV 87 10/03/2022 1632   MCH 29.0 01/11/2023 2133   MCHC 33.1 01/11/2023 2133   RDW 15.8 (H) 01/11/2023 2133   RDW 14.0 10/03/2022 1632   LYMPHSABS 1.4 10/03/2022 1632   MONOABS 0.5 01/28/2018 1841   EOSABS 0.2 10/03/2022 1632   BASOSABS 0.1 10/03/2022 1632    Lab Results  Component Value Date   HGBA1C 6.0 04/05/2023    Assessment & Plan:      Prediabetes Stable HbA1c at 6.0. Discussed the importance of diet and exercise in preventing progression to diabetes. -Continue current lifestyle modifications. -Repeat HbA1c at next routine visit.  Hypertension and Hyperlipidemia Well controlled on current regimen of Atorvastatin, Losartan, and Metoprolol. -Continue current medications.  Hypokalemia -Check potassium due to previous hypokalemia of 3.3  Pancreatitis History of pancreatitis due to alcohol use. Currently asymptomatic and has stopped alcohol consumption. -Order pancreatic enzyme levels to assess current status.  Lower Back Pain History of lumbar spinal stenosis Reports pain in lower back, possibly due to prolonged sitting while driving. -Recommend stretching exercises and use of a heating pad for symptom relief.  Tobacco Use Continues to smoke. Previous attempt to order a CT scan was unsuccessful due to  insurance issues. -Encourage smoking cessation. -Attempt to order CT scan again if insurance allows.  Influenza Vaccination Declined flu shot today due to previous side effects. -Consider offering again at next visit.          Meds ordered this encounter  Medications   atorvastatin (LIPITOR) 20 MG tablet    Sig: Take 1 tablet (20 mg total) by mouth daily.    Dispense:  90 tablet    Refill:  1   losartan (COZAAR) 25 MG tablet    Sig: Take 1 tablet (25 mg total) by mouth daily.    Dispense:  90 tablet    Refill:  1    Dose decrease   metoprolol tartrate (LOPRESSOR) 50 MG tablet    Sig: Take 1 tablet (50 mg total) by mouth 2 (two) times daily.    Dispense:  180 tablet    Refill:  1    Follow-up: Return in about 6 months (around 10/03/2023) for Chronic medical conditions.       Hoy Register, MD, FAAFP. [facility] [place], [place] [phone]   04/05/2023, 4:23 PM

## 2023-04-05 NOTE — Patient Instructions (Signed)
Acute Pancreatitis  Acute pancreatitis happens when a gland called the pancreas suddenly develops inflammation, making it irritated and swollen. The pancreas is found on the left side of the abdomen, behind the stomach. The pancreas makes proteins (enzymes) that help to digest food. It also releases the hormones glucagon and insulin. These help to regulate blood sugar. Most sudden (acute) attacks of this condition last a few days and can cause serious problems. Some people become dehydrated and develop low blood pressure. In severe cases, bleeding in the abdomen can lead to shock and can be life-threatening. The lungs, heart, and kidneys may stop working. What are the causes? This condition may be caused by: Heavy alcohol use. Drug use. Gallstones or other conditions that can block the tube that drains the pancreas (pancreatic duct). A tumor in the pancreas. Other causes include: Being exposed to certain medicines or certain chemicals. Having health conditions such as diabetes, high triglycerides, or high calcium levels in your blood. High calcium levels are usually caused by the parathyroid gland being too active. An infection in the pancreas. Damage caused by an accident (trauma) or by the poison (venom) of a scorpion sting. Abdominal surgery. Autoimmune pancreatitis. This is when the body's disease-fighting system (immune system) attacks the pancreas. Genes that are passed from parent to child (inherited). In some cases, the cause of this condition is not known. What are the signs or symptoms? Symptoms of this condition include: Pain in the upper abdomen that may spread (radiate) to the back. Pain may be severe and often worsens after you eat. A tender and swollen abdomen. Nausea and vomiting. Fever. How is this diagnosed? This condition may be diagnosed based on: A physical exam. Blood tests. These include an increased (elevated) level of lipase or amylase. Imaging tests, such as CT  scans, MRIs, or an ultrasound of the abdomen. How is this treated? Treatment for this condition often requires a hospital stay and may include: Pain medicine. IV fluids. Placing a tube in the stomach to remove stomach contents and to control vomiting (nasogastric tube, or NG tube). Not eating until vomiting has lessened. Treating any underlying conditions that may be the cause. Treatment may include: Antibiotic medicines, if your condition is caused by an infection. Steroid medicine, if your condition is caused by your immune system attacking your pancreas (autoimmune disease). Surgery on the gallbladder or pancreas, if your condition is caused by gallstones or another blockage. Follow these instructions at home: Medicines Take over-the-counter and prescription medicines only as told by your health care provider. If you were prescribed an antibiotic medicine, take it as told by your health care provider. Do not stop using the antibiotic even if you start to feel better. Ask your health care provider if the medicine prescribed to you: Requires you to avoid driving or using machinery. Can cause constipation. You may need to take these actions to prevent or treat constipation: Take over-the-counter or prescription medicines. Eat foods that are high in fiber, such as beans, whole grains, and fresh fruits and vegetables. Limit foods that are high in fat and processed sugars, such as fried or sweet foods. Eating and drinking  Follow instructions from your health care provider about diet. This may involve avoiding alcohol and having less fat in your diet. Eat smaller, more frequent meals. Doing this causes the pancreas to make less digestive fluid. Drink enough fluid to keep your urine pale yellow. Do not drink alcohol if it caused your condition. General instructions Do not use  any products that contain nicotine or tobacco. These products include cigarettes, chewing tobacco, and vaping devices,  such as e-cigarettes. If you need help quitting, ask your health care provider. Get plenty of rest. If directed, check your blood sugar at home as told by your health care provider. Keep all follow-up visits. This is important. Contact a health care provider if: You do not get better as fast as expected. Your symptoms get worse or you get new symptoms. You keep having pain, weakness, or nausea. You get better and then pain comes back. You have a fever. Get help right away if: You vomit every time you eat or drink. Your pain becomes severe. Your skin or the white parts of your eyes turn yellow (jaundice). You have sudden swelling in your abdomen. You feel dizzy or you faint. Your blood sugar is high (over 300 mg/dL). You vomit blood. These symptoms may be an emergency. Get help right away. Call 911. Do not wait to see if the symptoms will go away. Do not drive yourself to the hospital. Summary Acute pancreatitis happens when inflammation of the pancreas suddenly occurs and the pancreas becomes irritated and swollen. This condition is typically caused by heavy alcohol use, drug use, or gallstones. Treatment for this condition usually requires a stay in the hospital. This information is not intended to replace advice given to you by your health care provider. Make sure you discuss any questions you have with your health care provider. Document Revised: 06/01/2021 Document Reviewed: 06/01/2021 Elsevier Patient Education  2024 [employer].

## 2023-04-06 LAB — LIPASE: Lipase: 20 U/L (ref 13–78)

## 2023-04-06 LAB — BASIC METABOLIC PANEL
BUN/Creatinine Ratio: 14 (ref 9–20)
BUN: 14 mg/dL (ref 6–24)
CO2: 23 mmol/L (ref 20–29)
Calcium: 9.3 mg/dL (ref 8.7–10.2)
Chloride: 100 mmol/L (ref 96–106)
Creatinine, Ser: 1.02 mg/dL (ref 0.76–1.27)
Glucose: 95 mg/dL (ref 70–99)
Potassium: 4.9 mmol/L (ref 3.5–5.2)
Sodium: 138 mmol/L (ref 134–144)
eGFR: 86 mL/min/{1.73_m2} (ref >59)

## 2023-04-06 LAB — AMYLASE: Amylase: 57 U/L (ref 31–110)

## 2023-04-11 ENCOUNTER — Other Ambulatory Visit: Payer: Self-pay

## 2023-04-13 ENCOUNTER — Other Ambulatory Visit: Payer: Self-pay

## 2023-04-14 ENCOUNTER — Other Ambulatory Visit: Payer: Self-pay

## 2023-06-15 ENCOUNTER — Other Ambulatory Visit (HOSPITAL_COMMUNITY): Payer: Self-pay

## 2023-07-14 ENCOUNTER — Other Ambulatory Visit: Payer: Self-pay

## 2023-07-24 ENCOUNTER — Other Ambulatory Visit: Payer: Self-pay

## 2023-09-20 ENCOUNTER — Other Ambulatory Visit: Payer: Self-pay

## 2023-09-20 ENCOUNTER — Other Ambulatory Visit: Payer: Self-pay | Admitting: Family Medicine

## 2023-09-20 DIAGNOSIS — I10 Essential (primary) hypertension: Secondary | ICD-10-CM

## 2023-09-20 MED ORDER — LOSARTAN POTASSIUM 25 MG PO TABS
25.0000 mg | ORAL_TABLET | Freq: Every day | ORAL | 0 refills | Status: DC
Start: 2023-09-20 — End: 2023-10-03
  Filled 2023-09-20: qty 30, 30d supply, fill #0

## 2023-09-20 MED ORDER — METOPROLOL TARTRATE 50 MG PO TABS
50.0000 mg | ORAL_TABLET | Freq: Two times a day (BID) | ORAL | 0 refills | Status: DC
Start: 2023-09-20 — End: 2023-10-03
  Filled 2023-09-20: qty 60, 30d supply, fill #0

## 2023-09-29 ENCOUNTER — Other Ambulatory Visit: Payer: Self-pay

## 2023-09-29 ENCOUNTER — Telehealth: Payer: Self-pay | Admitting: Family Medicine

## 2023-09-29 NOTE — Telephone Encounter (Signed)
 Contacted pt confirm appt!

## 2023-10-03 ENCOUNTER — Encounter: Payer: Self-pay | Admitting: Family Medicine

## 2023-10-03 ENCOUNTER — Ambulatory Visit: Payer: BLUE CROSS/BLUE SHIELD | Attending: Family Medicine | Admitting: Family Medicine

## 2023-10-03 ENCOUNTER — Other Ambulatory Visit: Payer: Self-pay

## 2023-10-03 ENCOUNTER — Ambulatory Visit: Payer: Self-pay | Admitting: Family Medicine

## 2023-10-03 VITALS — BP 122/77 | HR 95 | Ht 70.0 in | Wt 208.0 lb

## 2023-10-03 DIAGNOSIS — M549 Dorsalgia, unspecified: Secondary | ICD-10-CM | POA: Diagnosis not present

## 2023-10-03 DIAGNOSIS — R42 Dizziness and giddiness: Secondary | ICD-10-CM

## 2023-10-03 DIAGNOSIS — R7303 Prediabetes: Secondary | ICD-10-CM | POA: Diagnosis not present

## 2023-10-03 DIAGNOSIS — F17210 Nicotine dependence, cigarettes, uncomplicated: Secondary | ICD-10-CM

## 2023-10-03 DIAGNOSIS — E785 Hyperlipidemia, unspecified: Secondary | ICD-10-CM

## 2023-10-03 DIAGNOSIS — Z23 Encounter for immunization: Secondary | ICD-10-CM | POA: Diagnosis not present

## 2023-10-03 DIAGNOSIS — M48062 Spinal stenosis, lumbar region with neurogenic claudication: Secondary | ICD-10-CM

## 2023-10-03 DIAGNOSIS — K219 Gastro-esophageal reflux disease without esophagitis: Secondary | ICD-10-CM

## 2023-10-03 DIAGNOSIS — I10 Essential (primary) hypertension: Secondary | ICD-10-CM

## 2023-10-03 LAB — POCT GLYCOSYLATED HEMOGLOBIN (HGB A1C): HbA1c, POC (prediabetic range): 6.1 % (ref 5.7–6.4)

## 2023-10-03 MED ORDER — OMEPRAZOLE 20 MG PO CPDR
20.0000 mg | DELAYED_RELEASE_CAPSULE | Freq: Every day | ORAL | 1 refills | Status: DC
  Filled 2023-10-03: qty 30, 30d supply, fill #0

## 2023-10-03 MED ORDER — ATORVASTATIN CALCIUM 20 MG PO TABS
20.0000 mg | ORAL_TABLET | Freq: Every day | ORAL | 1 refills | Status: DC
  Filled 2023-10-03: qty 90, 90d supply, fill #0

## 2023-10-03 MED ORDER — LOSARTAN POTASSIUM 25 MG PO TABS
25.0000 mg | ORAL_TABLET | Freq: Every day | ORAL | 1 refills | Status: DC
  Filled 2023-10-03: qty 90, 90d supply, fill #0
  Filled 2023-12-14 – 2024-01-03 (×3): qty 90, 90d supply, fill #1

## 2023-10-03 MED ORDER — METOPROLOL TARTRATE 25 MG PO TABS
25.0000 mg | ORAL_TABLET | Freq: Two times a day (BID) | ORAL | 1 refills | Status: DC
Start: 2023-10-03 — End: 2024-04-02
  Filled 2023-10-03: qty 180, 90d supply, fill #0
  Filled 2024-01-03: qty 180, 90d supply, fill #1

## 2023-10-03 NOTE — Progress Notes (Signed)
 Subjective:  Patient ID: Timothy Sanchez, male    DOB: 07/13/1966  Age: 58 y.o. MRN: [id number]  CC: Medical Management of Chronic Issues     Discussed the use of AI scribe software for clinical note transcription with the patient, who gave verbal consent to proceed.  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 The dizziness, which has been occurring for a while, seems to have worsened during Ramadan. The patient reports feeling dizzy after taking Metoprolol post-fast in the evening. The dizziness is transient and resolves on its own.  The patient also reports a history of heartburn, which is exacerbated by spicy food. He is currently managing it with Omeprazole taken after meals. The patient also has a history of prediabetes, which is currently stable with an A1c of 6.1. He is managing it with diet and exercise. The patient is a current smoker.    Past Medical History:  Diagnosis Date   Allergy    GERD (gastroesophageal reflux disease)    Hemorrhoids    Hyperlipidemia    Hypertension    Spinal stenosis of lumbar region    Vitamin D deficiency     Past Surgical History:  Procedure Laterality Date   NO PAST SURGERIES      Family History  Problem Relation Age of Onset   Diabetes Mother    Hypertension Mother    Colon cancer Neg Hx    Esophageal cancer Neg Hx    Rectal cancer Neg Hx    Stomach cancer Neg Hx     Social History   Socioeconomic History   Marital status: Married    Spouse name: Not on file   Number of children: 0   Years of education: Not on file   Highest education level: Not on file  Occupational History   Occupation: Delivery driver  Tobacco Use   Smoking status: Heavy Smoker    Current packs/day: 1.50    Average packs/day: 1.5 packs/day for 25.5 years  (38.3 ttl pk-yrs)    Types: Cigarettes    Start date: 03/26/2013   Smokeless tobacco: Never  Vaping Use   Vaping status: Never Used  Substance and Sexual Activity   Alcohol use: Yes    Alcohol/week: 8.0 standard drinks of alcohol    Types: 1 Cans of beer, 7 Shots of liquor per week    Comment: daily   Drug use: No   Sexual activity: Not on file  Other Topics Concern   Not on file  Social History Narrative   ** Merged History Encounter **       Social Drivers of Corporate investment banker Strain: Not on file  Food Insecurity: Not on file  Transportation Needs: Not on file  Physical Activity: Not on file  Stress: Not on file  Social Connections: Not on file    No Known Allergies  Outpatient Medications Prior to Visit  Medication Sig Dispense Refill   Cholecalciferol (VITAMIN D3 PO) Take 1 tablet by mouth daily.     Cyanocobalamin (VITAMIN B-12 PO) Take 1 tablet by mouth daily.     ondansetron (ZOFRAN) 4 MG tablet Take 1 tablet (4 mg total) by mouth every 6 (six) hours. 12 tablet 0   atorvastatin (LIPITOR) 20 MG tablet Take 1 tablet (20 mg total) by mouth daily. 90 tablet 1  losartan (COZAAR) 25 MG tablet Take 1 tablet (25 mg total) by mouth daily. 30 tablet 0   metoprolol tartrate (LOPRESSOR) 50 MG tablet Take 1 tablet (50 mg total) by mouth 2 (two) times daily. 60 tablet 0   omeprazole (PRILOSEC) 20 MG capsule Take 1 capsule (20 mg total) by mouth daily. 90 capsule 1   No facility-administered medications prior to visit.     ROS Review of Systems  Constitutional:  Negative for activity change and appetite change.  HENT:  Negative for sinus pressure and sore throat.   Respiratory:  Negative for chest tightness, shortness of breath and wheezing.   Cardiovascular:  Negative for chest pain and palpitations.  Gastrointestinal:  Negative for abdominal distention, abdominal pain and constipation.  Genitourinary: Negative.   Musculoskeletal:  Positive for back pain.   Psychiatric/Behavioral:  Negative for behavioral problems and dysphoric mood.     Objective:  BP 122/77   Pulse 95   Ht 5\' 10"  (1.778 m)   Wt 208 lb (94.3 kg)   SpO2 95%   BMI 29.84 kg/m      10/03/2023    3:31 PM 04/05/2023    3:38 PM 04/05/2023    3:18 PM  BP/Weight  Systolic BP 122 134 141  Diastolic BP 77 87 92  Wt. (Lbs) 208    BMI 29.84 kg/m2        Physical Exam Constitutional:      Appearance: He is well-developed.  Cardiovascular:     Rate and Rhythm: Normal rate.     Heart sounds: Normal heart sounds. No murmur heard. Pulmonary:     Effort: Pulmonary effort is normal.     Breath sounds: Normal breath sounds. No wheezing or rales.  Chest:     Chest wall: No tenderness.  Abdominal:     General: Bowel sounds are normal. There is no distension.     Palpations: Abdomen is soft. There is no mass.     Tenderness: There is no abdominal tenderness.  Musculoskeletal:     Right lower leg: No edema.     Left lower leg: No edema.     Comments: Negative straight leg raise bilaterally  Neurological:     Mental Status: He is alert and oriented to person, place, and time.  Psychiatric:        Mood and Affect: Mood normal.        Latest Ref Rng & Units 04/05/2023    4:05 PM 01/11/2023    9:33 PM 10/03/2022    4:32 PM  CMP  Glucose 70 - 99 mg/dL 95  098  119   BUN 6 - 24 mg/dL 14  17  17    Creatinine 0.76 - 1.27 mg/dL 1.47  8.29  5.62   Sodium 134 - 144 mmol/L 138  131  138   Potassium 3.5 - 5.2 mmol/L 4.9  3.3  4.3   Chloride 96 - 106 mmol/L 100  100  101   CO2 20 - 29 mmol/L 23  22  22    Calcium 8.7 - 10.2 mg/dL 9.3  8.8  9.2   Total Protein 6.5 - 8.1 g/dL  6.5  6.0   Total Bilirubin 0.3 - 1.2 mg/dL  0.6  0.5   Alkaline Phos 38 - 126 U/L  59  75   AST 15 - 41 U/L  30  22   ALT 0 - 44 U/L  24  18     Lipid Panel  Component Value Date/Time   CHOL 198 10/03/2022 1632   TRIG 209 (H) 10/03/2022 1632   HDL 61 10/03/2022 1632   CHOLHDL 4.1 03/31/2021  1447   CHOLHDL 2.0 05/18/2016 1511   VLDL 16 05/18/2016 1511   LDLCALC 102 (H) 10/03/2022 1632    CBC    Component Value Date/Time   WBC 6.9 01/11/2023 2133   RBC 5.68 01/11/2023 2133   HGB 16.5 01/11/2023 2133   HGB 15.6 10/03/2022 1632   HCT 49.8 01/11/2023 2133   HCT 46.9 10/03/2022 1632   PLT 187 01/11/2023 2133   PLT 187 10/03/2022 1632   MCV 87.7 01/11/2023 2133   MCV 87 10/03/2022 1632   MCH 29.0 01/11/2023 2133   MCHC 33.1 01/11/2023 2133   RDW 15.8 (H) 01/11/2023 2133   RDW 14.0 10/03/2022 1632   LYMPHSABS 1.4 10/03/2022 1632   MONOABS 0.5 01/28/2018 1841   EOSABS 0.2 10/03/2022 1632   BASOSABS 0.1 10/03/2022 1632    Lab Results  Component Value Date   HGBA1C 6.1 10/03/2023       Assessment & Plan Dizziness Intermittent dizziness linked to metoprolol, especially after breaking fast. Stable blood pressure suggests medication timing or fasting as cause. No dizziness when metoprolol skipped.  He has no blood pressure monitor to check his blood pressure during these episodes - Reduce metoprolol dose from 50 mg twice daily to 25 mg twice daily. - Continue losartan as prescribed. - Monitor blood pressure, especially in the evening after breaking fast.  GERD (Gastroesophageal Reflux Disease) Persistent heartburn after spicy foods despite omeprazole. Advised omeprazole before meals to prevent symptoms. - Advise taking omeprazole before meals to prevent heartburn. - Counsel on reducing spicy food intake to manage symptoms.  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 Discussed lumbar support and lidocaine patch for management. - Recommend use of lumbar support while driving. - Suggest use of heating pad at home for pain relief. - Consider lidocaine patch for pain management.  Prediabetes Well-managed with A1c at 6.1. Advised continuation of lifestyle modifications. - Continue regular exercise. - Limit  starch and sweets intake. - Order blood tests to check cholesterol, kidney, liver function, and blood sugar levels.  Tobacco Use Continued use with increased pneumonia risk. Agreed to vaccination after discussing risks. - Administer pneumonia vaccination due to increased risk from smoking. -Ordered CT scan for lung cancer screening but unfortunately due to insurance issues he has been unable to complete this  Hyperlipidemia -Controlled on statin  Medication Supply for Travel Requires medication supply for overseas travel. - Provide a 90-day supply of medications for travel.      Meds ordered this encounter  Medications   metoprolol tartrate (LOPRESSOR) 25 MG tablet    Sig: Take 1 tablet (25 mg total) by mouth 2 (two) times daily.    Dispense:  180 tablet    Refill:  1    Dose decrease. 90-day supply, traveling out of the country   atorvastatin (LIPITOR) 20 MG tablet    Sig: Take 1 tablet (20 mg total) by mouth daily.    Dispense:  90 tablet    Refill:  1    90-day supply, traveling out of the country   losartan (COZAAR) 25 MG tablet    Sig: Take 1 tablet (25 mg total) by mouth daily.    Dispense:  90 tablet    Refill:  1    90-day supply, traveling out of the  country   omeprazole (PRILOSEC) 20 MG capsule    Sig: Take 1 capsule (20 mg total) by mouth daily.    Dispense:  90 capsule    Refill:  1    90-day supply, traveling out of the country    Follow-up: Return in about 6 months (around 04/04/2024) for Chronic medical conditions.       Hoy Register, MD, FAAFP. [facility] [place], [place] [phone]   10/03/2023, 3:52 PM

## 2023-10-03 NOTE — Patient Instructions (Signed)
 VISIT SUMMARY:  During today's visit, we discussed your dizziness, heartburn, lower back pain, prediabetes, and tobacco use. We also addressed your need for medication supply for your upcoming overseas travel.  YOUR PLAN:  -DIZZINESS: Your dizziness appears to be linked to the timing of your Metoprolol dose, especially after breaking your fast. We will reduce your Metoprolol dose to 25 mg twice daily and continue your Losartan as prescribed. Please monitor your blood pressure, especially in the evening after breaking your fast.  -GERD (GASTROESOPHAGEAL REFLUX DISEASE): GERD is a condition where stomach acid frequently flows back into the tube connecting your mouth and stomach, causing heartburn. To manage your symptoms, take Omeprazole before meals and try to reduce your intake of spicy foods.  -LOWER BACK PAIN: Your chronic lower back pain, which worsens with driving, can be managed with lumbar support and heat. We recommend using lumbar support while driving and a heating pad at home. You may also consider using a lidocaine patch for pain relief.  -PREDIABETES: Prediabetes means your blood sugar levels are higher than normal but not yet high enough to be diagnosed as diabetes. Your condition is well-managed with an A1c of 6.0. Continue your regular exercise and limit your intake of starches and sweets. We will also order blood tests to check your cholesterol, kidney, liver function, and blood sugar levels.  -TOBACCO USE: Smoking increases your risk of pneumonia. We will administer a pneumonia vaccination to help reduce this risk.  -MEDICATION SUPPLY FOR TRAVEL: We will provide you with a 90-day supply of your medications for your upcoming overseas travel.  INSTRUCTIONS:  Please monitor your blood pressure, especially in the evening after breaking your fast. Continue your regular exercise and limit your intake of starches and sweets. We will order blood tests to check your cholesterol, kidney,  liver function, and blood sugar levels. You will also receive a pneumonia vaccination to reduce your risk of pneumonia due to smoking.

## 2023-10-06 ENCOUNTER — Other Ambulatory Visit: Payer: Self-pay

## 2023-12-14 ENCOUNTER — Other Ambulatory Visit: Payer: Self-pay

## 2024-01-03 ENCOUNTER — Other Ambulatory Visit: Payer: Self-pay

## 2024-01-04 ENCOUNTER — Other Ambulatory Visit: Payer: Self-pay

## 2024-03-19 ENCOUNTER — Other Ambulatory Visit: Payer: Self-pay | Admitting: Family Medicine

## 2024-03-19 DIAGNOSIS — I10 Essential (primary) hypertension: Secondary | ICD-10-CM

## 2024-03-20 ENCOUNTER — Other Ambulatory Visit: Payer: Self-pay

## 2024-03-20 MED ORDER — LOSARTAN POTASSIUM 25 MG PO TABS
25.0000 mg | ORAL_TABLET | Freq: Every day | ORAL | 0 refills | Status: DC
  Filled 2024-03-20: qty 30, 30d supply, fill #0

## 2024-04-01 ENCOUNTER — Telehealth: Payer: Self-pay | Admitting: Family Medicine

## 2024-04-01 NOTE — Telephone Encounter (Signed)
Pt confirmed appt 9/9

## 2024-04-02 ENCOUNTER — Other Ambulatory Visit: Payer: Self-pay

## 2024-04-02 ENCOUNTER — Ambulatory Visit: Payer: Self-pay | Attending: Family Medicine | Admitting: Family Medicine

## 2024-04-02 ENCOUNTER — Encounter: Payer: Self-pay | Admitting: Family Medicine

## 2024-04-02 VITALS — BP 138/87 | HR 94 | Temp 97.8°F | Resp 14 | Ht 70.0 in | Wt 217.0 lb

## 2024-04-02 DIAGNOSIS — I10 Essential (primary) hypertension: Secondary | ICD-10-CM

## 2024-04-02 DIAGNOSIS — Z1159 Encounter for screening for other viral diseases: Secondary | ICD-10-CM

## 2024-04-02 DIAGNOSIS — K219 Gastro-esophageal reflux disease without esophagitis: Secondary | ICD-10-CM

## 2024-04-02 DIAGNOSIS — Z125 Encounter for screening for malignant neoplasm of prostate: Secondary | ICD-10-CM

## 2024-04-02 DIAGNOSIS — F17210 Nicotine dependence, cigarettes, uncomplicated: Secondary | ICD-10-CM

## 2024-04-02 DIAGNOSIS — E785 Hyperlipidemia, unspecified: Secondary | ICD-10-CM

## 2024-04-02 DIAGNOSIS — R7303 Prediabetes: Secondary | ICD-10-CM

## 2024-04-02 LAB — POCT GLYCOSYLATED HEMOGLOBIN (HGB A1C): HbA1c, POC (prediabetic range): 6.1 % (ref 5.7–6.4)

## 2024-04-02 MED ORDER — ATORVASTATIN CALCIUM 20 MG PO TABS
20.0000 mg | ORAL_TABLET | Freq: Every day | ORAL | 1 refills | Status: DC
  Filled 2024-04-02: qty 90, 90d supply, fill #0

## 2024-04-02 MED ORDER — METOPROLOL TARTRATE 25 MG PO TABS
25.0000 mg | ORAL_TABLET | Freq: Two times a day (BID) | ORAL | 1 refills | Status: AC
  Filled 2024-04-02: qty 180, 90d supply, fill #0
  Filled 2024-08-15: qty 180, 90d supply, fill #1

## 2024-04-02 MED ORDER — OMEPRAZOLE 20 MG PO CPDR
20.0000 mg | DELAYED_RELEASE_CAPSULE | Freq: Every day | ORAL | 1 refills | Status: AC
  Filled 2024-04-02: qty 30, 30d supply, fill #0

## 2024-04-02 MED ORDER — LOSARTAN POTASSIUM 25 MG PO TABS
25.0000 mg | ORAL_TABLET | Freq: Every day | ORAL | 1 refills | Status: DC
  Filled 2024-04-02: qty 90, 90d supply, fill #0
  Filled 2024-06-17: qty 90, 90d supply, fill #1

## 2024-04-02 NOTE — Progress Notes (Signed)
 Subjective:  Patient ID: Timothy Sanchez, male    DOB: 1965-09-20  Age: 58 y.o. MRN: [id number]  CC: Prediabetes     Discussed the use of AI scribe software for clinical note transcription with the patient, who gave verbal consent to proceed.  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  He regularly takes atorvastatin , losartan , and metoprolol  for cholesterol and blood pressure management, and omeprazole  for acid reflux but is unable to create omeprazole  due to spicy food consumption which worsens his reflux symptoms.  He smokes one pack per day, having started in middle school, and expresses a desire to quit smoking independently without patches or pills.  He experiences back pain, particularly in the morning, which improves with a shower and movement. Prolonged sitting while driving worsens the pain. He occasionally uses a pain patch for relief but does not take medication for the pain. He has no pedal edema.    Past Medical History:  Diagnosis Date   Allergy    GERD (gastroesophageal reflux disease)    Hemorrhoids    Hyperlipidemia    Hypertension    Spinal stenosis of lumbar region    Vitamin D  deficiency     Past Surgical History:  Procedure Laterality Date   NO PAST SURGERIES      Family History  Problem Relation Age of Onset   Diabetes Mother    Hypertension Mother    Colon cancer Neg Hx    Esophageal cancer Neg Hx    Rectal cancer Neg Hx    Stomach cancer Neg Hx     Social History   Socioeconomic History   Marital status: Married    Spouse name: Not on file   Number of children: 0   Years of education: Not on file   Highest education level: Not on file  Occupational History   Occupation: Delivery  driver  Tobacco Use   Smoking status: Heavy Smoker    Current packs/day: 1.50    Average packs/day: 1.5 packs/day for 26.0 years (39.0 ttl pk-yrs)    Types: Cigarettes    Start date: 03/26/2013   Smokeless tobacco: Never  Vaping Use   Vaping status: Never Used  Substance and Sexual Activity   Alcohol use: Yes    Alcohol/week: 8.0 standard drinks of alcohol    Types: 1 Cans of beer, 7 Shots of liquor per week    Comment: daily   Drug use: No   Sexual activity: Not on file  Other Topics Concern   Not on file  Social History Narrative   ** Merged History Encounter **       Social Drivers of Corporate investment banker Strain: Not on file  Food Insecurity: Not on file  Transportation Needs: Not on file  Physical Activity: Not on file  Stress: Not on file  Social Connections: Not on file    No Known Allergies  Outpatient Medications Prior to Visit  Medication Sig Dispense Refill   Cholecalciferol (VITAMIN D3 PO) Take 1 tablet by mouth daily.     Cyanocobalamin (VITAMIN B-12 PO) Take 1 tablet by mouth daily.     ondansetron  (ZOFRAN ) 4 MG tablet Take 1 tablet (4 mg total) by mouth every 6 (six)  hours. 12 tablet 0   atorvastatin  (LIPITOR) 20 MG tablet Take 1 tablet (20 mg total) by mouth daily. 90 tablet 1   losartan  (COZAAR ) 25 MG tablet Take 1 tablet (25 mg total) by mouth daily. 30 tablet 0   metoprolol  tartrate (LOPRESSOR ) 25 MG tablet Take 1 tablet (25 mg total) by mouth 2 (two) times daily. 180 tablet 1   omeprazole  (PRILOSEC) 20 MG capsule Take 1 capsule (20 mg total) by mouth daily. 90 capsule 1   No facility-administered medications prior to visit.     ROS Review of Systems  Constitutional:  Negative for activity change and appetite change.  HENT:  Negative for sinus pressure and sore throat.   Respiratory:  Negative for chest tightness, shortness of breath and wheezing.   Cardiovascular:  Negative for chest pain and palpitations.  Gastrointestinal:  Negative for  abdominal distention, abdominal pain and constipation.  Genitourinary: Negative.   Musculoskeletal:  Positive for back pain.  Psychiatric/Behavioral:  Negative for behavioral problems and dysphoric mood.     Objective:  BP 138/87 (BP Location: Right Arm, Patient Position: Sitting, Cuff Size: Normal)   Pulse 94   Temp 97.8 F (36.6 C) (Oral)   Resp 14   Ht 5' 10 (1.778 m)   Wt 217 lb (98.4 kg)   SpO2 98%   BMI 31.14 kg/m      04/02/2024    2:34 PM 10/03/2023    3:31 PM 04/05/2023    3:38 PM  BP/Weight  Systolic BP 138 122 134  Diastolic BP 87 77 87  Wt. (Lbs) 217 208   BMI 31.14 kg/m2 29.84 kg/m2       Physical Exam Constitutional:      Appearance: He is well-developed.  Cardiovascular:     Rate and Rhythm: Normal rate.     Heart sounds: Normal heart sounds. No murmur heard. Pulmonary:     Effort: Pulmonary effort is normal.     Breath sounds: Normal breath sounds. No wheezing or rales.  Chest:     Chest wall: No tenderness.  Abdominal:     General: Bowel sounds are normal. There is no distension.     Palpations: Abdomen is soft. There is no mass.     Tenderness: There is no abdominal tenderness.  Musculoskeletal:        General: Normal range of motion.     Right lower leg: No edema.     Left lower leg: No edema.     Comments: No tenderness on palpation of the lumbar spine. Negative straight leg raise bilaterally  Neurological:     Mental Status: He is alert and oriented to person, place, and time.  Psychiatric:        Mood and Affect: Mood normal.        Latest Ref Rng & Units 04/05/2023    4:05 PM 01/11/2023    9:33 PM 10/03/2022    4:32 PM  CMP  Glucose 70 - 99 mg/dL 95  821  888   BUN 6 - 24 mg/dL 14  17  17    Creatinine 0.76 - 1.27 mg/dL 8.97  9.06  9.06   Sodium 134 - 144 mmol/L 138  131  138   Potassium 3.5 - 5.2 mmol/L 4.9  3.3  4.3   Chloride 96 - 106 mmol/L 100  100  101   CO2 20 - 29 mmol/L 23  22  22    Calcium  8.7 - 10.2 mg/dL 9.3  8.8  9.2   Total Protein 6.5 - 8.1 g/dL  6.5  6.0   Total Bilirubin 0.3 - 1.2 mg/dL  0.6  0.5   Alkaline Phos 38 - 126 U/L  59  75   AST 15 - 41 U/L  30  22   ALT 0 - 44 U/L  24  18     Lipid Panel     Component Value Date/Time   CHOL 198 10/03/2022 1632   TRIG 209 (H) 10/03/2022 1632   HDL 61 10/03/2022 1632   CHOLHDL 4.1 03/31/2021 1447   CHOLHDL 2.0 05/18/2016 1511   VLDL 16 05/18/2016 1511   LDLCALC 102 (H) 10/03/2022 1632    CBC    Component Value Date/Time   WBC 6.9 01/11/2023 2133   RBC 5.68 01/11/2023 2133   HGB 16.5 01/11/2023 2133   HGB 15.6 10/03/2022 1632   HCT 49.8 01/11/2023 2133   HCT 46.9 10/03/2022 1632   PLT 187 01/11/2023 2133   PLT 187 10/03/2022 1632   MCV 87.7 01/11/2023 2133   MCV 87 10/03/2022 1632   MCH 29.0 01/11/2023 2133   MCHC 33.1 01/11/2023 2133   RDW 15.8 (H) 01/11/2023 2133   RDW 14.0 10/03/2022 1632   LYMPHSABS 1.4 10/03/2022 1632   MONOABS 0.5 01/28/2018 1841   EOSABS 0.2 10/03/2022 1632   BASOSABS 0.1 10/03/2022 1632    Lab Results  Component Value Date   HGBA1C 6.1 04/02/2024       Assessment & Plan Hypertension Blood pressure well-controlled with current medication. - Continue losartan  25 mg oral daily. - Continue metoprolol  tartrate 50 mg oral bid. - Order fasting blood tests for kidney and liver function.  Hyperlipidemia Cholesterol management ongoing with atorvastatin . Last test over a year ago. - Continue atorvastatin  20 mg oral daily. - Order fasting blood test for cholesterol levels.  Gastroesophageal reflux disease (GERD) GERD symptoms persist, managed with omeprazole . - Continue omeprazole  20 mg oral daily. - Send omeprazole  prescription to pharmacy.  Lower back pain secondary to lumbar spinal stenosis Chronic pain worsened by sitting and driving. No current medication, uses patches, finds relief with movement and showering. - Recommend back exercises  -His work schedule would not allow for PT referral -  Advise use of heating pad for pain relief. - Encourage breaks during long drives to walk and stretch.  Greater than 20-pack-year history Long-term smoker, interested in quitting without pharmacological aids. Insurance now covers CT scan. - Encourage reduction  - Order CT scan of lungs for cancer screening, pending insurance update.   Prediabetes A1c of 6.1 Continue lifestyle modification  Healthcare maintenance Screening for prostate cancer-PSA ordered Screening for viral disease-hepatitis B antibody titer ordered  Meds ordered this encounter  Medications   atorvastatin  (LIPITOR) 20 MG tablet    Sig: Take 1 tablet (20 mg total) by mouth daily.    Dispense:  90 tablet    Refill:  1   losartan  (COZAAR ) 25 MG tablet    Sig: Take 1 tablet (25 mg total) by mouth daily.    Dispense:  90 tablet    Refill:  1   metoprolol  tartrate (LOPRESSOR ) 25 MG tablet    Sig: Take 1 tablet (25 mg total) by mouth 2 (two) times daily.    Dispense:  180 tablet    Refill:  1   omeprazole  (PRILOSEC) 20 MG capsule    Sig: Take 1 capsule (20 mg total) by mouth daily.    Dispense:  90 capsule  Refill:  1    Follow-up: Return in about 6 months (around 09/30/2024) for Chronic medical conditions.       Corrina Sabin, MD, FAAFP. [facility] [place], [place] [phone]   04/02/2024, 4:47 PM

## 2024-04-02 NOTE — Patient Instructions (Signed)
 VISIT SUMMARY:  You had a routine follow-up visit today. You are doing well overall and continue to manage your work and health effectively. We discussed your current medications and some ongoing health concerns, including your blood pressure, cholesterol, acid reflux, back pain, and smoking habits.  YOUR PLAN:  -HYPERTENSION: Hypertension means high blood pressure. Your blood pressure is well-controlled with your current medications. Please continue taking losartan  25 mg once daily and metoprolol  tartrate 50 mg twice daily. We will also order fasting blood tests to check your kidney and liver function.  -HYPERLIPIDEMIA: Hyperlipidemia means high cholesterol levels. You are managing this with atorvastatin . Please continue taking atorvastatin  20 mg once daily. We will order a fasting blood test to check your cholesterol levels since your last test was over a year ago.  -GASTROESOPHAGEAL REFLUX DISEASE (GERD): GERD is a condition where stomach acid frequently flows back into the tube connecting your mouth and stomach. Your symptoms are being managed with omeprazole . Please continue taking omeprazole  20 mg once daily. We will send your prescription to the pharmacy.  -LOWER BACK PAIN SECONDARY TO LUMBAR SPINAL STENOSIS: Lumbar spinal stenosis is a narrowing of the spaces within your spine, which can put pressure on the nerves that travel through the spine. Your back pain is worsened by sitting and driving. We recommend doing back exercises available on YouTube, using a heating pad for pain relief, and taking breaks during long drives to walk and stretch.  -NICOTINE DEPENDENCE, CIGARETTES: Nicotine dependence means you are addicted to the nicotine in cigarettes. You have expressed a desire to quit smoking without using patches or pills. We encourage you to reduce your smoking to half a pack per day. We will also order a CT scan of your lungs for cancer screening, as your insurance now covers  it.  INSTRUCTIONS:  Please follow up with the fasting blood tests for kidney and liver function, and cholesterol levels. Additionally, schedule a CT scan of your lungs for cancer screening. Continue with your current medications and the recommended lifestyle changes. If you have any new symptoms or concerns, please contact our office.

## 2024-04-03 ENCOUNTER — Ambulatory Visit: Payer: Self-pay | Admitting: Family Medicine

## 2024-04-03 ENCOUNTER — Other Ambulatory Visit: Payer: Self-pay

## 2024-04-03 DIAGNOSIS — E785 Hyperlipidemia, unspecified: Secondary | ICD-10-CM

## 2024-04-03 LAB — CMP14+EGFR
ALT: 21 IU/L (ref 0–44)
AST: 22 IU/L (ref 0–40)
Albumin: 4.2 g/dL (ref 3.8–4.9)
Alkaline Phosphatase: 67 IU/L (ref 44–121)
BUN/Creatinine Ratio: 17 (ref 9–20)
BUN: 18 mg/dL (ref 6–24)
Bilirubin Total: 0.4 mg/dL (ref 0.0–1.2)
CO2: 24 mmol/L (ref 20–29)
Calcium: 9.3 mg/dL (ref 8.7–10.2)
Chloride: 99 mmol/L (ref 96–106)
Creatinine, Ser: 1.05 mg/dL (ref 0.76–1.27)
Globulin, Total: 2.2 g/dL (ref 1.5–4.5)
Glucose: 101 mg/dL — ABNORMAL HIGH (ref 70–99)
Potassium: 4.8 mmol/L (ref 3.5–5.2)
Sodium: 136 mmol/L (ref 134–144)
Total Protein: 6.4 g/dL (ref 6.0–8.5)
eGFR: 83 mL/min/1.73 (ref >59)

## 2024-04-03 LAB — PSA, TOTAL AND FREE
PSA, Free Pct: 37.5 %
PSA, Free: 0.15 ng/mL
Prostate Specific Ag, Serum: 0.4 ng/mL (ref 0.0–4.0)

## 2024-04-03 LAB — LP+NON-HDL CHOLESTEROL
Cholesterol, Total: 238 mg/dL — ABNORMAL HIGH (ref 100–199)
HDL: 54 mg/dL (ref >39)
LDL Chol Calc (NIH): 129 mg/dL — ABNORMAL HIGH (ref 0–99)
Total Non-HDL-Chol (LDL+VLDL): 184 mg/dL — ABNORMAL HIGH (ref 0–129)
Triglycerides: 308 mg/dL — ABNORMAL HIGH (ref 0–149)
VLDL Cholesterol Cal: 55 mg/dL — ABNORMAL HIGH (ref 5–40)

## 2024-04-03 LAB — HEPATITIS B SURFACE ANTIBODY, QUANTITATIVE: Hepatitis B Surf Ab Quant: <3.5 m[IU]/mL — ABNORMAL LOW

## 2024-04-03 MED ORDER — ATORVASTATIN CALCIUM 40 MG PO TABS
40.0000 mg | ORAL_TABLET | Freq: Every day | ORAL | 1 refills | Status: AC
  Filled 2024-04-03: qty 90, 90d supply, fill #0

## 2024-04-12 ENCOUNTER — Other Ambulatory Visit: Payer: Self-pay

## 2024-08-15 ENCOUNTER — Other Ambulatory Visit: Payer: Self-pay | Admitting: Family Medicine

## 2024-08-15 ENCOUNTER — Other Ambulatory Visit: Payer: Self-pay

## 2024-08-15 DIAGNOSIS — I10 Essential (primary) hypertension: Secondary | ICD-10-CM

## 2024-08-15 MED ORDER — LOSARTAN POTASSIUM 25 MG PO TABS
25.0000 mg | ORAL_TABLET | Freq: Every day | ORAL | 0 refills | Status: AC
  Filled 2024-08-15: qty 90, 90d supply, fill #0

## 2024-08-20 ENCOUNTER — Other Ambulatory Visit: Payer: Self-pay

## 2024-09-30 ENCOUNTER — Ambulatory Visit: Payer: Self-pay | Admitting: Family Medicine
